# Patient Record
Sex: Female | Born: 1975 | Race: White | Hispanic: No | Marital: Single | Smoking: Former smoker
Health system: Southern US, Community
[De-identification: ages and names within clinical notes are randomized; demographics above are authoritative.]

## PROBLEM LIST (undated history)

## (undated) DIAGNOSIS — E669 Obesity, unspecified: Secondary | ICD-10-CM

## (undated) HISTORY — PX: ANKLE FRACTURE SURGERY: SHX122

## (undated) HISTORY — DX: Obesity, unspecified: E66.9

---

## 2014-09-20 ENCOUNTER — Ambulatory Visit: Payer: Self-pay | Admitting: Family Medicine

## 2014-09-27 ENCOUNTER — Ambulatory Visit (INDEPENDENT_AMBULATORY_CARE_PROVIDER_SITE_OTHER): Payer: PRIVATE HEALTH INSURANCE | Admitting: Sports Medicine

## 2014-09-27 ENCOUNTER — Encounter: Payer: Self-pay | Admitting: Sports Medicine

## 2014-09-27 VITALS — BP 100/65 | HR 89 | Ht 65.0 in | Wt 175.0 lb

## 2014-09-27 DIAGNOSIS — R109 Unspecified abdominal pain: Secondary | ICD-10-CM | POA: Diagnosis not present

## 2014-09-27 DIAGNOSIS — Z0000 Encounter for general adult medical examination without abnormal findings: Secondary | ICD-10-CM | POA: Insufficient documentation

## 2014-09-27 DIAGNOSIS — R21 Rash and other nonspecific skin eruption: Secondary | ICD-10-CM | POA: Insufficient documentation

## 2014-09-27 DIAGNOSIS — L989 Disorder of the skin and subcutaneous tissue, unspecified: Secondary | ICD-10-CM | POA: Diagnosis not present

## 2014-09-27 DIAGNOSIS — Z30011 Encounter for initial prescription of contraceptive pills: Secondary | ICD-10-CM

## 2014-09-27 LAB — POCT URINE PREGNANCY: Preg Test, Ur: NEGATIVE

## 2014-09-27 NOTE — Assessment & Plan Note (Signed)
Checking routine blood work including STD screening. Referral to OB/GYN for consideration of Nexplanon, and to initiate cervical cancer screening.

## 2014-09-27 NOTE — Assessment & Plan Note (Signed)
We will start with over-the-counter Gas-X. I do think this is going to end up being IBS.

## 2014-09-27 NOTE — Progress Notes (Signed)
  Subjective:    CC: Establish care.   HPI:  This is a very pleasant 39 year old female, she comes in to establish care, she would like STD screening, routine blood work, she has a skin lesion on her right anterior upper arm, and mild abdominal discomfort. The abdominal discomfort is located in the left and right lower quadrants, moderate, persistent without radiation, she does get fairly flatulent, and notes improvement in her symptoms with passing gas. She denies any dysuria, no vaginal discharge, and symptoms are not catamenial.  Past medical history, Surgical history, Family history not pertinant except as noted below, Social history, Allergies, and medications have been entered into the medical record, reviewed, and no changes needed.   Review of Systems: No headache, visual changes, nausea, vomiting, diarrhea, constipation, dizziness, abdominal pain, skin rash, fevers, chills, night sweats, swollen lymph nodes, weight loss, chest pain, body aches, joint swelling, muscle aches, shortness of breath, mood changes, visual or auditory hallucinations.  Objective:    General: Well Developed, well nourished, and in no acute distress.  Neuro: Alert and oriented x3, extra-ocular muscles intact, sensation grossly intact.  HEENT: Normocephalic, atraumatic, pupils equal round reactive to light, neck supple, no masses, no lymphadenopathy, thyroid nonpalpable.  Skin: Warm and dry, no rashes noted. There is a 1.5 cm rounded dome shaped lesion that has a variegated appearance, and some hair on the anterior right upper arm. Cardiac: Regular rate and rhythm, no murmurs rubs or gallops.  Respiratory: Clear to auscultation bilaterally. Not using accessory muscles, speaking in full sentences.  Abdominal: Soft, nontender, nondistended, positive bowel sounds, no masses, no organomegaly.  Musculoskeletal: Shoulder, elbow, wrist, hip, knee, ankle stable, and with full range of motion.  Procedure:  Excision of   right upper arm mole Risks, benefits, and alternatives explained and consent obtained. Time out conducted. Surface prepped with alcohol. 5cc lidocaine with epinephine infiltrated in a field block. Adequate anesthesia ensured. Area prepped and draped in a sterile fashion. Excision performed with: Using a 6 mm punch biopsy removed the lesion, the incision was then closed with a 5-0 Ethilon single interrupted suture Hemostasis achieved. Pt stable.  Impression and Recommendations:    The patient was counselled, risk factors were discussed, anticipatory guidance given.

## 2014-09-27 NOTE — Assessment & Plan Note (Signed)
Surgical excision as above. Return in 7 days for suture removal.

## 2014-10-02 LAB — CBC
HCT: 44.7 % (ref 36.0–46.0)
Hemoglobin: 15 g/dL (ref 12.0–15.0)
MCH: 32.5 pg (ref 26.0–34.0)
MCHC: 33.6 g/dL (ref 30.0–36.0)
MCV: 96.8 fL (ref 78.0–100.0)
MPV: 10.4 fL (ref 8.6–12.4)
Platelets: 323 K/uL (ref 150–400)
RBC: 4.62 MIL/uL (ref 3.87–5.11)
RDW: 12.6 % (ref 11.5–15.5)
WBC: 9.6 10*3/uL (ref 4.0–10.5)

## 2014-10-02 LAB — GC/CHLAMYDIA PROBE AMP, URINE
Chlamydia, Swab/Urine, PCR: NEGATIVE
GC Probe Amp, Urine: NEGATIVE

## 2014-10-02 LAB — URINALYSIS
Bilirubin Urine: NEGATIVE
Glucose, UA: NEGATIVE mg/dL
Hgb urine dipstick: NEGATIVE
Ketones, ur: NEGATIVE mg/dL
Leukocytes, UA: NEGATIVE
Nitrite: NEGATIVE
Protein, ur: NEGATIVE mg/dL
Specific Gravity, Urine: 1.028 (ref 1.005–1.030)
Urobilinogen, UA: 0.2 mg/dL (ref 0.0–1.0)
pH: 5.5 (ref 5.0–8.0)

## 2014-10-02 LAB — COMPREHENSIVE METABOLIC PANEL
ALT: 12 U/L (ref 0–35)
AST: 14 U/L (ref 0–37)
Albumin: 4.1 g/dL (ref 3.5–5.2)
BUN: 16 mg/dL (ref 6–23)
Calcium: 9.3 mg/dL (ref 8.4–10.5)
Sodium: 139 mEq/L (ref 135–145)
Total Protein: 7.4 g/dL (ref 6.0–8.3)

## 2014-10-02 LAB — COMPREHENSIVE METABOLIC PANEL WITH GFR
Alkaline Phosphatase: 55 U/L (ref 39–117)
CO2: 24 meq/L (ref 19–32)
Chloride: 103 meq/L (ref 96–112)
Creat: 0.84 mg/dL (ref 0.50–1.10)
Glucose, Bld: 100 mg/dL — ABNORMAL HIGH (ref 70–99)
Potassium: 4.3 meq/L (ref 3.5–5.3)
Total Bilirubin: 0.4 mg/dL (ref 0.2–1.2)

## 2014-10-02 LAB — LIPID PANEL
Cholesterol: 165 mg/dL (ref 0–200)
HDL: 42 mg/dL — ABNORMAL LOW (ref >=46)
LDL Cholesterol: 102 mg/dL — ABNORMAL HIGH (ref 0–99)
Total CHOL/HDL Ratio: 3.9 ratio
Triglycerides: 103 mg/dL (ref <150)
VLDL: 21 mg/dL (ref 0–40)

## 2014-10-02 LAB — HEMOGLOBIN A1C
Hgb A1c MFr Bld: 5.3 % (ref <5.7)
Mean Plasma Glucose: 105 mg/dL (ref <117)

## 2014-10-02 LAB — HEPATITIS PANEL, ACUTE
HCV Ab: NEGATIVE
Hep A IgM: NONREACTIVE
Hep B C IgM: NONREACTIVE
Hepatitis B Surface Ag: NEGATIVE

## 2014-10-02 LAB — HIV ANTIBODY (ROUTINE TESTING W REFLEX): HIV 1&2 Ab, 4th Generation: NONREACTIVE

## 2014-10-02 LAB — RPR

## 2014-10-02 LAB — TSH: TSH: 1.597 u[IU]/mL (ref 0.350–4.500)

## 2014-10-04 LAB — HSV(HERPES SMPLX)ABS-I+II(IGG+IGM)-BLD
HSV 1 Glycoprotein G Ab, IgG: 4.12 IV — ABNORMAL HIGH
HSV 2 Glycoprotein G Ab, IgG: 9.53 IV — ABNORMAL HIGH
Herpes Simplex Vrs I&II-IgM Ab (EIA): 1.19 INDEX — ABNORMAL HIGH

## 2015-11-15 ENCOUNTER — Encounter: Payer: Self-pay | Admitting: Sports Medicine

## 2015-11-15 ENCOUNTER — Ambulatory Visit (INDEPENDENT_AMBULATORY_CARE_PROVIDER_SITE_OTHER): Payer: PRIVATE HEALTH INSURANCE | Admitting: Sports Medicine

## 2015-11-15 VITALS — BP 116/70 | HR 93 | Resp 16 | Wt 173.7 lb

## 2015-11-15 DIAGNOSIS — G43009 Migraine without aura, not intractable, without status migrainosus: Secondary | ICD-10-CM | POA: Diagnosis not present

## 2015-11-15 DIAGNOSIS — N921 Excessive and frequent menstruation with irregular cycle: Secondary | ICD-10-CM | POA: Diagnosis not present

## 2015-11-15 MED ORDER — NORGESTIMATE-ETH ESTRADIOL 0.25-35 MG-MCG PO TABS: 1.0000 | ORAL_TABLET | Freq: Every day | ORAL | Status: DC

## 2015-11-15 MED ORDER — RIZATRIPTAN BENZOATE 10 MG PO TBDP: 10.0000 mg | ORAL_TABLET | ORAL | Status: DC | PRN

## 2015-11-15 NOTE — Progress Notes (Signed)
  Subjective:    CC: Couple of issues  HPI: Headaches: Moderate, persistent, bitemporal, associated with photophobia and phonophobia, slight nausea. They last for several days. Has multiple per month.  Metrorrhagia: Bleeding every 2 weeks, unsure if she's pregnant, never got Nexplanon, minimal pain with menstruation.  Past medical history, Surgical history, Family history not pertinant except as noted below, Social history, Allergies, and medications have been entered into the medical record, reviewed, and no changes needed.   Review of Systems: No fevers, chills, night sweats, weight loss, chest pain, or shortness of breath.   Objective:    General: Well Developed, well nourished, and in no acute distress.  Neuro: Alert and oriented x3, extra-ocular muscles intact, sensation grossly intact. Cranial nerves II through XII are intact, motor, sensory, and coordinative functions are all intact.  HEENT: Normocephalic, atraumatic, pupils equal round reactive to light, neck supple, no masses, no lymphadenopathy, thyroid nonpalpable.  Skin: Warm and dry, no rashes. Cardiac: Regular rate and rhythm, no murmurs rubs or gallops, no lower extremity edema.  Respiratory: Clear to auscultation bilaterally. Not using accessory muscles, speaking in full sentences. Abdomen: Soft, nontender, nondistended, normal bowel sounds, no palpable masses, no guarding, rigidity, rebound tenderness.  Impression and Recommendations:    I spent 25 minutes with this patient, greater than 50% was face-to-face time counseling regarding the above diagnoses

## 2015-11-15 NOTE — Assessment & Plan Note (Signed)
Starting OCPs and Maxalt.

## 2015-11-15 NOTE — Assessment & Plan Note (Signed)
Patient unable to void so sending for serum hCG, TSH, CBC, PT, PTT,  CMP. Pelvic and transvaginal ultrasound, and essentially teeing her up forreferral to OB/GYN

## 2015-11-16 ENCOUNTER — Telehealth: Payer: Self-pay

## 2015-11-16 LAB — COMPREHENSIVE METABOLIC PANEL
ALT: 15 U/L (ref 6–29)
AST: 19 U/L (ref 10–30)
Albumin: 4.5 g/dL (ref 3.6–5.1)
Alkaline Phosphatase: 46 U/L (ref 33–115)
BUN: 11 mg/dL (ref 7–25)
Calcium: 9.4 mg/dL (ref 8.6–10.2)
Glucose, Bld: 63 mg/dL — ABNORMAL LOW (ref 65–99)
Potassium: 3.9 mmol/L (ref 3.5–5.3)
Total Bilirubin: 0.4 mg/dL (ref 0.2–1.2)
Total Protein: 7.7 g/dL (ref 6.1–8.1)

## 2015-11-16 LAB — HCG, SERUM, QUALITATIVE: Preg, Serum: NEGATIVE

## 2015-11-16 LAB — PROTIME-INR
INR: 0.98 (ref <1.50)
Prothrombin Time: 13.1 s (ref 11.6–15.2)

## 2015-11-16 LAB — CBC
HCT: 45.2 % — ABNORMAL HIGH (ref 35.0–45.0)
Hemoglobin: 15.2 g/dL (ref 11.7–15.5)
MCH: 33.1 pg — ABNORMAL HIGH (ref 27.0–33.0)
MCHC: 33.6 g/dL (ref 32.0–36.0)
MCV: 98.5 fL (ref 80.0–100.0)
MPV: 9.6 fL (ref 7.5–12.5)
Platelets: 337 K/uL (ref 140–400)
RBC: 4.59 MIL/uL (ref 3.80–5.10)
RDW: 12.6 % (ref 11.0–15.0)
WBC: 8 K/uL (ref 3.8–10.8)

## 2015-11-16 LAB — COMPREHENSIVE METABOLIC PANEL WITH GFR
CO2: 23 mmol/L (ref 20–31)
Chloride: 103 mmol/L (ref 98–110)
Creat: 0.86 mg/dL (ref 0.50–1.10)
Sodium: 140 mmol/L (ref 135–146)

## 2015-11-16 LAB — APTT: aPTT: 28 s (ref 24–37)

## 2015-11-16 LAB — TSH: TSH: 1.45 mIU/L

## 2015-11-16 NOTE — Telephone Encounter (Signed)
Patient called and states the insurance company will not pay for disintegrating tablets. Could she has this switched to the plain tablets. Please advise.

## 2015-11-17 MED ORDER — RIZATRIPTAN BENZOATE 10 MG PO TABS: 10.0000 mg | ORAL_TABLET | ORAL | Status: DC | PRN

## 2015-11-17 NOTE — Telephone Encounter (Signed)
Left message advising of the new prescription.

## 2015-11-17 NOTE — Telephone Encounter (Signed)
Done

## 2015-11-21 NOTE — Addendum Note (Signed)
Addended by: Huel Cote on: 11/21/2015 10:57 AM   Modules accepted: Orders

## 2015-11-21 NOTE — Addendum Note (Signed)
Addended by: Huel Cote on: 11/21/2015 10:55 AM   Modules accepted: Orders

## 2015-11-24 ENCOUNTER — Ambulatory Visit (INDEPENDENT_AMBULATORY_CARE_PROVIDER_SITE_OTHER): Payer: PRIVATE HEALTH INSURANCE

## 2015-11-24 DIAGNOSIS — N83201 Unspecified ovarian cyst, right side: Secondary | ICD-10-CM | POA: Diagnosis not present

## 2015-11-24 DIAGNOSIS — N921 Excessive and frequent menstruation with irregular cycle: Secondary | ICD-10-CM

## 2015-11-24 DIAGNOSIS — N83202 Unspecified ovarian cyst, left side: Secondary | ICD-10-CM | POA: Diagnosis not present

## 2015-11-28 ENCOUNTER — Telehealth: Payer: Self-pay | Admitting: *Deleted

## 2015-11-28 NOTE — Telephone Encounter (Addendum)
Called patient and left a message asking her if she was able to get the last prescription filled( non disintegrating tablets) received a a request for PA but its not clear if its for the new rx or disintegrating tabs.submitted PA anyway.

## 2015-11-29 NOTE — Telephone Encounter (Signed)
Response back from insurance was not prior auth required

## 2015-12-08 ENCOUNTER — Encounter: Payer: Self-pay | Admitting: Obstetrics & Gynecology

## 2015-12-08 ENCOUNTER — Ambulatory Visit (INDEPENDENT_AMBULATORY_CARE_PROVIDER_SITE_OTHER): Payer: PRIVATE HEALTH INSURANCE | Admitting: Obstetrics & Gynecology

## 2015-12-08 VITALS — BP 110/71 | HR 96 | Resp 16 | Ht 63.0 in | Wt 173.0 lb

## 2015-12-08 DIAGNOSIS — Z124 Encounter for screening for malignant neoplasm of cervix: Secondary | ICD-10-CM | POA: Diagnosis not present

## 2015-12-08 DIAGNOSIS — N83202 Unspecified ovarian cyst, left side: Secondary | ICD-10-CM

## 2015-12-08 DIAGNOSIS — Z01419 Encounter for gynecological examination (general) (routine) without abnormal findings: Secondary | ICD-10-CM | POA: Diagnosis not present

## 2015-12-08 DIAGNOSIS — N83201 Unspecified ovarian cyst, right side: Secondary | ICD-10-CM | POA: Diagnosis not present

## 2015-12-08 DIAGNOSIS — Z1151 Encounter for screening for human papillomavirus (HPV): Secondary | ICD-10-CM | POA: Diagnosis not present

## 2015-12-08 DIAGNOSIS — Z113 Encounter for screening for infections with a predominantly sexual mode of transmission: Secondary | ICD-10-CM

## 2015-12-08 NOTE — Progress Notes (Signed)
Subjective:    Colleen Miller is a 40 y.o. SWP2 (45 and 68 yo daughters and 59 yo grandson) female who presents for an annual exam and to discuss her u/s finding of normal ovarian cysts. The patient has no complaints today. The u/s was ordered to evaluate her irregular periods, which come every 2 weeks for the last 2 months.  Her TSH was normal. The patient is sexually active. GYN screening history: last pap: was normal. The patient wears seatbelts: yes. The patient participates in regular exercise: yes. Has the patient ever been transfused or tattooed?: yes. The patient reports that there is not domestic violence in her life.   Menstrual History: OB History    Gravida Para Term Preterm AB TAB SAB Ectopic Multiple Living   2 2 2       2       Menarche age: 55  Patient's last menstrual period was 12/05/2015.    The following portions of the patient's history were reviewed and updated as appropriate: allergies, current medications, past family history, past medical history, past social history, past surgical history and problem list.  Review of Systems Pertinent items are noted in HPI.  Works at [employer]. Monogamous for about 2 years, lives together. Some dyspareunia for about 6 months, positional. She doesn't use contraception, for 15 years. She had a "bad case of syphillis about 8 years ago". She does not want OCPs.   Objective:    BP 110/71 mmHg  Pulse 96  Resp 16  Ht 5' 3"  (1.6 m)  Wt 173 lb (78.472 kg)  BMI 30.65 kg/m2  LMP 12/05/2015  General Appearance:    Alert, cooperative, no distress, appears stated age  Head:    Normocephalic, without obvious abnormality, atraumatic  Eyes:    PERRL, conjunctiva/corneas clear, EOM's intact, fundi    benign, both eyes  Ears:    Normal TM's and external ear canals, both ears  Nose:   Nares normal, septum midline, mucosa normal, no drainage    or sinus tenderness  Throat:   Lips, mucosa, and tongue normal; teeth and gums normal  Neck:    Supple, symmetrical, trachea midline, no adenopathy;    thyroid:  no enlargement/tenderness/nodules; no carotid   bruit or JVD  Back:     Symmetric, no curvature, ROM normal, no CVA tenderness  Lungs:     Clear to auscultation bilaterally, respirations unlabored  Chest Wall:    No tenderness or deformity   Heart:    Regular rate and rhythm, S1 and S2 normal, no murmur, rub   or gallop  Breast Exam:    No tenderness, masses, or nipple abnormality  Abdomen:     Soft, non-tender, bowel sounds active all four quadrants,    no masses, no organomegaly  Genitalia:    Normal female without lesion, discharge or tenderness, NSSA, NT, mobile, normal adnexal exam     Extremities:   Extremities normal, atraumatic, no cyanosis or edema  Pulses:   2+ and symmetric all extremities  Skin:   Skin color, texture, turgor normal, no rashes or lesions  Lymph nodes:   Cervical, supraclavicular, and axillary nodes normal  Neurologic:   CNII-XII intact, normal strength, sensation and reflexes    throughout  .    Assessment:    Healthy female exam.   DUB   Plan:     Thin prep Pap smear. with cotesting She declines contraception or OCPs for treatment of DUB

## 2015-12-14 LAB — CYTOLOGY - PAP

## 2015-12-24 ENCOUNTER — Other Ambulatory Visit (HOSPITAL_COMMUNITY): Payer: Self-pay | Admitting: Obstetrics & Gynecology

## 2015-12-24 DIAGNOSIS — Z1231 Encounter for screening mammogram for malignant neoplasm of breast: Secondary | ICD-10-CM

## 2016-01-04 ENCOUNTER — Ambulatory Visit: Payer: PRIVATE HEALTH INSURANCE

## 2016-02-29 ENCOUNTER — Emergency Department (INDEPENDENT_AMBULATORY_CARE_PROVIDER_SITE_OTHER)
Admission: EM | Admit: 2016-02-29 | Discharge: 2016-02-29 | Disposition: A | Discharge status: Home / Self Care | Payer: PRIVATE HEALTH INSURANCE | Source: Home / Self Care | Attending: Family Medicine | Admitting: Family Medicine

## 2016-02-29 DIAGNOSIS — L089 Local infection of the skin and subcutaneous tissue, unspecified: Secondary | ICD-10-CM | POA: Diagnosis not present

## 2016-02-29 DIAGNOSIS — W57XXXA Bitten or stung by nonvenomous insect and other nonvenomous arthropods, initial encounter: Secondary | ICD-10-CM | POA: Diagnosis not present

## 2016-02-29 DIAGNOSIS — T148 Other injury of unspecified body region: Secondary | ICD-10-CM

## 2016-02-29 MED ORDER — HYDROXYZINE HCL 25 MG PO TABS: 25.0000 mg | ORAL_TABLET | Freq: Three times a day (TID) | ORAL | 0 refills | Status: DC | PRN

## 2016-02-29 MED ORDER — CEPHALEXIN 500 MG PO CAPS: 500.0000 mg | ORAL_CAPSULE | Freq: Three times a day (TID) | ORAL | 0 refills | Status: AC

## 2016-02-29 MED ORDER — HYDROCORTISONE 2.5 % EX LOTN: TOPICAL_LOTION | Freq: Two times a day (BID) | CUTANEOUS | 0 refills | Status: DC

## 2016-02-29 NOTE — ED Triage Notes (Signed)
Patient states she noticed 2 spider/insects bites on both of her arms at lunch time yesterday. Site is red, swollen, painful, itchy. States she has not tried anything OTC for relief but did put rubbing alcohol on site to help with the itching.

## 2016-02-29 NOTE — ED Provider Notes (Signed)
CSN: [id number]     Arrival date & time 02/29/16  1925 History   First MD Initiated Contact with Patient 02/29/16 1938     Chief Complaint  Patient presents with  . Insect Bite   (Consider location/radiation/quality/duration/timing/severity/associated sxs/prior Treatment) HPI  Colleen Miller is a 40 y.o. female presenting to UC with c/o 2 small red bumps, one on each forearm, that started yesterday. Moderately pruritic with worsening redness, and drainage from bump on Right forearm.  She has not tried anything besides rubbing alcohol for symptoms. No relief. Denies fever, chills, n/v/d. Pt notes she does wear a long-sleeve smock at work and wonders if there was something in the sleeves that cause current rash/possible insect bites. No new soaps, lotions or medications. No other rashes.    Past Medical History:  Diagnosis Date  . Obesity (BMI 30-39.9)    Past Surgical History:  Procedure Laterality Date  . ANKLE FRACTURE SURGERY     Family History  Problem Relation Age of Onset  . Hyperlipidemia Mother   . Cancer Maternal Grandmother     pancreatic  . Heart attack Maternal Grandfather    Social History  Substance Use Topics  . Smoking status: Former Smoker    Packs/day: 0.50    Years: 26.00    Types: Cigarettes  . Smokeless tobacco: Never Used  . Alcohol use No   OB History    Gravida Para Term Preterm AB Living   2 2 2     2    SAB TAB Ectopic Multiple Live Births                 Review of Systems  Constitutional: Negative for chills and fever.  Gastrointestinal: Negative for nausea and vomiting.  Musculoskeletal: Negative for arthralgias and myalgias.  Skin: Positive for color change, rash and wound. Negative for pallor.    Allergies  Review of patient's allergies indicates no known allergies.  Home Medications   Prior to Admission medications   Medication Sig Start Date End Date Taking? Authorizing Provider  cephALEXin (KEFLEX) 500 MG capsule Take 1 capsule  (500 mg total) by mouth 3 (three) times daily. 02/29/16 03/07/16  Noland Fordyce, PA-C  hydrocortisone 2.5 % lotion Apply topically 2 (two) times daily. As needed for itching 02/29/16   Noland Fordyce, PA-C  hydrOXYzine (ATARAX/VISTARIL) 25 MG tablet Take 1 tablet (25 mg total) by mouth every 8 (eight) hours as needed for itching or vomiting. 02/29/16   Noland Fordyce, PA-C  rizatriptan (MAXALT) 10 MG tablet Take 1 tablet (10 mg total) by mouth as needed for migraine. May repeat in 2 hours if needed Patient not taking: Reported on 12/08/2015 11/17/15   Silverio Decamp, MD   Meds Ordered and Administered this Visit  Medications - No data to display  BP 101/68 (BP Location: Left Arm)   Pulse 95   Temp 98.2 F (36.8 C) (Oral)   Ht 5' 5"  (1.651 m)   Wt 184 lb (83.5 kg)   LMP 02/15/2016   SpO2 98%   BMI 30.62 kg/m  No data found.   Physical Exam  Constitutional: She is oriented to person, place, and time. She appears well-developed and well-nourished.  HENT:  Head: Normocephalic and atraumatic.  Mouth/Throat: Oropharynx is clear and moist.  Cardiovascular: Normal rate.   Pulmonary/Chest: Effort normal. No respiratory distress.  Musculoskeletal: Normal range of motion. She exhibits tenderness. She exhibits no edema.  Full ROM bilateral arms, mild tenderness to forearms (See skin exam)  Neurological: She is alert and oriented to person, place, and time.  Skin: Skin is warm and dry. Rash noted. There is erythema.  Right forearm, dorsal aspect: 0.5cm pustule with small amount of yellow purulent drainage oozing out of lesion.  1cm are of erythema surrounding pustule. No induration or fluctuance. Mildly tender. Left forearm, dorsal aspect: 0.5cm pustule with minimal surrounding erythema. No bleeding or drainage.    Nursing note and vitals reviewed.   Urgent Care Course   Clinical Course    Procedures (including critical care time)  Labs Review Labs Reviewed - No data to  display  Imaging Review No results found.    MDM   1. Infected insect bite    Pt presenting to [facility] with rash and a pustule on both forearms. Pustule on Right forearm spontaneously draining. C/w infected insect bites.  Rx: keflex, hydrocortisone cream and atarax   Encouraged to keep wounds clean with soap and water. F/u wih PCP or return to [facility] in 3-4 days if not improving, sooner if worsening. Patient verbalized understanding and agreement with treatment plan.     Noland Fordyce, PA-C 03/01/16 [phone]

## 2016-02-29 NOTE — Discharge Instructions (Signed)
°  Keep areas clean with warm soap and water. You may use over the counter antibiotic ointment on open wound.  If rash is itching, you may use the hydrocortisone cream as prescribed. Please be sure to take oral antibiotic pills (cephalexin- Keflex) as prescribed and complete entire course even if feeling better to ensure infection does not come back.

## 2016-05-02 LAB — HM MAMMOGRAPHY

## 2016-05-08 ENCOUNTER — Encounter: Payer: Self-pay | Admitting: Sports Medicine

## 2016-05-09 ENCOUNTER — Encounter: Payer: Self-pay | Admitting: Sports Medicine

## 2016-08-06 ENCOUNTER — Ambulatory Visit (INDEPENDENT_AMBULATORY_CARE_PROVIDER_SITE_OTHER): Payer: PRIVATE HEALTH INSURANCE | Admitting: Obstetrics & Gynecology

## 2016-08-06 ENCOUNTER — Encounter: Payer: Self-pay | Admitting: Obstetrics & Gynecology

## 2016-08-06 VITALS — BP 107/61 | HR 81 | Resp 16 | Ht 64.0 in | Wt 178.0 lb

## 2016-08-06 DIAGNOSIS — B9689 Other specified bacterial agents as the cause of diseases classified elsewhere: Secondary | ICD-10-CM

## 2016-08-06 DIAGNOSIS — B3731 Acute candidiasis of vulva and vagina: Secondary | ICD-10-CM

## 2016-08-06 DIAGNOSIS — B373 Candidiasis of vulva and vagina: Secondary | ICD-10-CM | POA: Diagnosis not present

## 2016-08-06 DIAGNOSIS — N760 Acute vaginitis: Secondary | ICD-10-CM | POA: Diagnosis not present

## 2016-08-06 MED ORDER — METRONIDAZOLE 500 MG PO TABS: 500.0000 mg | ORAL_TABLET | Freq: Two times a day (BID) | ORAL | 6 refills | Status: DC

## 2016-08-06 MED ORDER — FLUCONAZOLE 150 MG PO TABS: 150.0000 mg | ORAL_TABLET | Freq: Once | ORAL | 3 refills | Status: AC

## 2016-08-06 NOTE — Progress Notes (Signed)
   Subjective:    Patient ID: Colleen Miller, female    DOB: [birth year], 41 y.o.   MRN: [id number]  HPI 41 yo lady here with a week of a "smelly vaginal discharge". She has had BV in the past.   Review of Systems     Objective:   Physical Exam Pleasant [id number] Breathing, conversing, and ambulating normally Vaginal discharge c/w BV and yeast       Assessment & Plan:  BV and yeast-  Treat with flagyl and diflucan, refills given Rec boric acid supp prn and probiotics daily

## 2016-11-29 ENCOUNTER — Encounter: Payer: Self-pay | Admitting: Emergency Medicine

## 2016-11-29 ENCOUNTER — Other Ambulatory Visit: Payer: Self-pay

## 2016-11-29 ENCOUNTER — Emergency Department (INDEPENDENT_AMBULATORY_CARE_PROVIDER_SITE_OTHER)
Admission: EM | Admit: 2016-11-29 | Discharge: 2016-11-29 | Disposition: A | Discharge status: Home / Self Care | Payer: PRIVATE HEALTH INSURANCE | Source: Home / Self Care | Attending: Family Medicine | Admitting: Family Medicine

## 2016-11-29 ENCOUNTER — Emergency Department (INDEPENDENT_AMBULATORY_CARE_PROVIDER_SITE_OTHER): Payer: PRIVATE HEALTH INSURANCE

## 2016-11-29 DIAGNOSIS — M940 Chondrocostal junction syndrome [Tietze]: Secondary | ICD-10-CM

## 2016-11-29 DIAGNOSIS — R079 Chest pain, unspecified: Secondary | ICD-10-CM

## 2016-11-29 MED ORDER — MELOXICAM 15 MG PO TABS: 15.0000 mg | ORAL_TABLET | Freq: Every day | ORAL | 1 refills | Status: DC

## 2016-11-29 NOTE — ED Triage Notes (Signed)
Began noticing chest pain upon inspiration at 11:30am today. No recent URI, muscle strain, or injury. No known history of heart disease.

## 2016-11-29 NOTE — Discharge Instructions (Signed)
Apply ice pack for 20 to 30 minutes, 3 to 4 times daily  Continue until pain and swelling decrease.  May take Ibuprofen today as needed.

## 2016-11-29 NOTE — ED Provider Notes (Signed)
[facility]    CSN: [id number] Arrival date & time: 11/29/16  1451     History   Chief Complaint Chief Complaint  Patient presents with  . Chest Pain    with inspiration    HPI Colleen Miller is a 41 y.o. female.   At 11:30am today patient began noticing pain in her left anterior chest with inspiration.  The pain does not radiate.  No nausea/vomiting or sweats.  No recent URI, injury or muscle strain.  No history of heart disease.  She does have a family history of ASCVD.   The history is provided by the patient.  Chest Pain  Pain location:  L chest and substernal area Pain quality: stabbing   Pain quality: not radiating   Pain radiates to:  Does not radiate Pain severity:  Mild Onset quality:  Sudden Duration:  4 hours Timing:  Intermittent Progression:  Unchanged Chronicity:  New Context: breathing and movement   Relieved by:  None tried Worsened by:  Coughing, movement and deep breathing Ineffective treatments:  None tried Associated symptoms: no abdominal pain, no AICD problem, no cough, no diaphoresis, no dysphagia, no fatigue, no fever, no heartburn, no lower extremity edema, no nausea, no near-syncope, no numbness, no palpitations, no shortness of breath and no syncope     Past Medical History:  Diagnosis Date  . Obesity (BMI 30-39.9)     Patient Active Problem List   Diagnosis Date Noted  . Migraine headache without aura 11/15/2015  . Metrorrhagia 11/15/2015  . Annual physical exam 09/27/2014    Past Surgical History:  Procedure Laterality Date  . ANKLE FRACTURE SURGERY      OB History    Gravida Para Term Preterm AB Living   2 2 2     2    SAB TAB Ectopic Multiple Live Births                   Home Medications    Prior to Admission medications   Medication Sig Start Date End Date Taking? Authorizing Provider  meloxicam (MOBIC) 15 MG tablet Take 1 tablet (15 mg total) by mouth daily. Take with food each morning 11/29/16   Kandra Nicolas, MD  rizatriptan (MAXALT) 10 MG tablet Take 1 tablet (10 mg total) by mouth as needed for migraine. May repeat in 2 hours if needed Patient not taking: Reported on 08/06/2016 11/17/15   Silverio Decamp, MD    Family History Family History  Problem Relation Age of Onset  . Hyperlipidemia Mother   . Cancer Maternal Grandmother        pancreatic  . Heart attack Maternal Grandfather     Social History Social History  Substance Use Topics  . Smoking status: Former Smoker    Packs/day: 0.50    Years: 26.00    Types: Cigarettes  . Smokeless tobacco: Never Used  . Alcohol use No     Allergies   Patient has no known allergies.   Review of Systems Review of Systems  Constitutional: Negative for diaphoresis, fatigue and fever.  HENT: Negative for trouble swallowing.   Respiratory: Negative for cough and shortness of breath.   Cardiovascular: Positive for chest pain. Negative for palpitations, syncope and near-syncope.  Gastrointestinal: Negative for abdominal pain, heartburn and nausea.  Neurological: Negative for numbness.     Physical Exam Triage Vital Signs ED Triage Vitals  Enc Vitals Group     BP 11/29/16 1507 120/77  Pulse Rate 11/29/16 1507 82     Resp 11/29/16 1507 16     Temp 11/29/16 1507 98.1 F (36.7 C)     Temp Source 11/29/16 1507 Oral     SpO2 11/29/16 1507 99 %     Weight 11/29/16 1508 170 lb (77.1 kg)     Height 11/29/16 1508 5' 5"  (1.651 m)     Head Circumference --      Peak Flow --      Pain Score 11/29/16 1509 5     Pain Loc --      Pain Edu? --      Excl. in [place]? --    No data found.   Updated Vital Signs BP 120/77 (BP Location: Left Arm)   Pulse 82   Temp 98.1 F (36.7 C) (Oral)   Resp 16   Ht 5' 5"  (1.651 m)   Wt 170 lb (77.1 kg)   LMP 11/20/2016   SpO2 99%   BMI 28.29 kg/m   Visual Acuity Right Eye Distance:   Left Eye Distance:   Bilateral Distance:    Right Eye Near:   Left Eye Near:    Bilateral  Near:     Physical Exam  Constitutional: She appears well-developed and well-nourished. No distress.  HENT:  Head: Normocephalic.  Nose: Nose normal.  Mouth/Throat: Oropharynx is clear and moist.  Eyes: Conjunctivae are normal. Pupils are equal, round, and reactive to light.  Neck: Neck supple.  Cardiovascular: Normal rate, regular rhythm and normal heart sounds.   Pulmonary/Chest: Breath sounds normal. She exhibits tenderness.    Chest:  Distinct tenderness to palpation over the upper sternum as noted on diagram.  Palpation there recreates her chest pain.  Abdominal: There is no tenderness.  Musculoskeletal: She exhibits no edema or tenderness.  Lymphadenopathy:    She has no cervical adenopathy.  Neurological: She is alert.  Skin: Skin is warm and dry.  Nursing note and vitals reviewed.    UC Treatments / Results  Labs (all labs ordered are listed, but only abnormal results are displayed) Labs Reviewed - No data to display  EKG  EKG Interpretation  Rate:  83 BPM PR:  142 msec QT:  376 msec QTcH:  441 msec QRSD:  76 msec QRS axis:  49 degrees Interpretation:  Normal sinus rhythm; within normal limits        Radiology Dg Chest 2 View  Result Date: 11/29/2016 CLINICAL DATA:  Chest pain. EXAM: CHEST  2 VIEW COMPARISON:  No recent prior. FINDINGS: Mediastinum and hilar structures normal. Lungs are clear. Heart size normal. No pleural effusion or pneumothorax. Thoracic spine scoliosis. IMPRESSION: No acute cardiopulmonary disease. Electronically Signed   By: Marcello Moores  Register   On: 11/29/2016 15:36    Procedures Procedures (including critical care time)  Medications Ordered in UC Medications - No data to display   Initial Impression / Assessment and Plan / UC Course  I have reviewed the triage vital signs and the nursing notes.  Pertinent labs & imaging results that were available during my care of the patient were reviewed by me and considered in my medical  decision making (see chart for details).    Normal EKG and chest X-ray reassuring. Begin Mobic 42m daily. Apply ice pack for 20 to 30 minutes, 3 to 4 times daily  Continue until pain and swelling decrease.  May take Ibuprofen today as needed, and begin Mobic tomorrow. Followup with Dr. TAundria Memsor Dr. EEllard Artis  Georgina Snell ([facility]) if not improving about two weeks.     Final Clinical Impressions(s) / UC Diagnoses   Final diagnoses:  Costochondritis, acute    New Prescriptions New Prescriptions   MELOXICAM (MOBIC) 15 MG TABLET    Take 1 tablet (15 mg total) by mouth daily. Take with food each morning     Kandra Nicolas, MD 12/10/16 [phone]

## 2018-04-05 IMAGING — US US TRANSVAGINAL NON-OB
1 series · 13 of 25 positions shown · non-contrast
Comparison: None

CLINICAL DATA: Fatty menses every 2 weeks for the past 2 months,
bleeding starts off normally and changes to spotting, metrorrhagia



[Series 1: us transvaginal non-ob · 0.17mm/px · 13 of 103 slices shown]
[im 1/103]
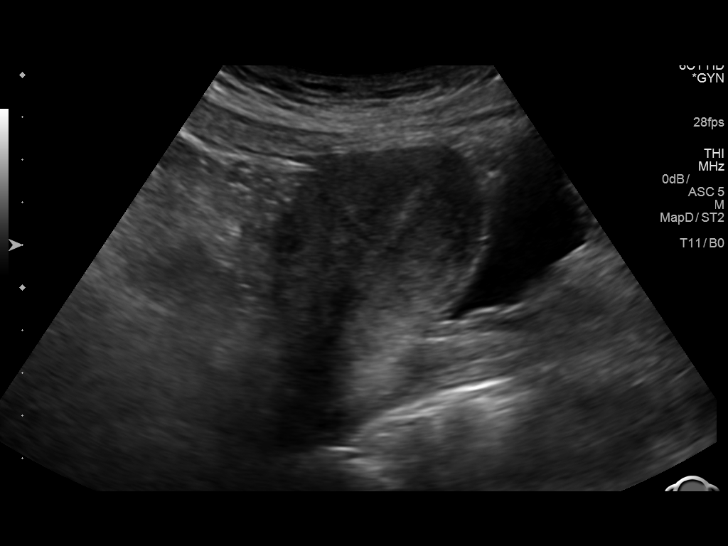
[im 9/103]
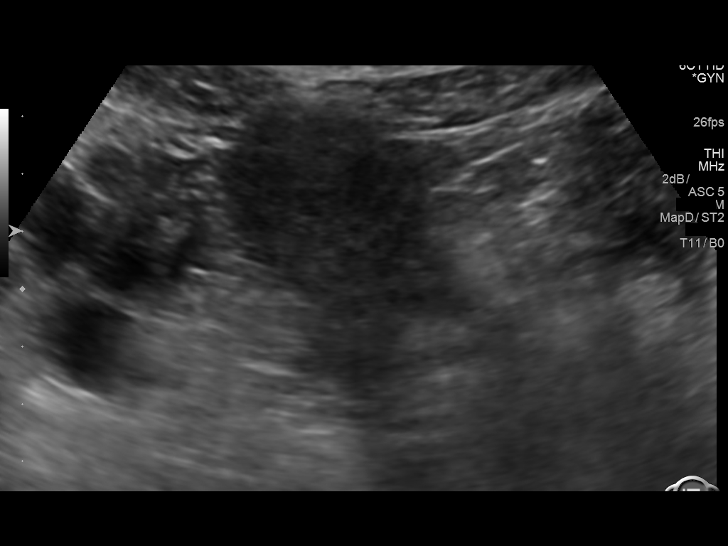
[im 18/103]
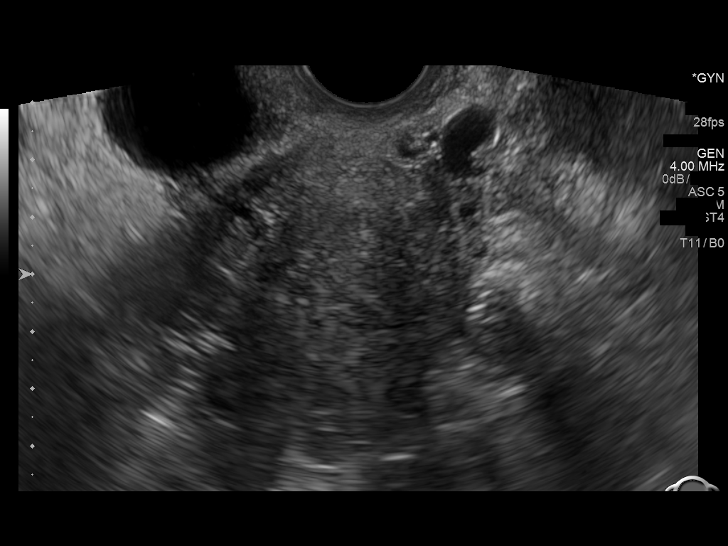
[im 26/103]
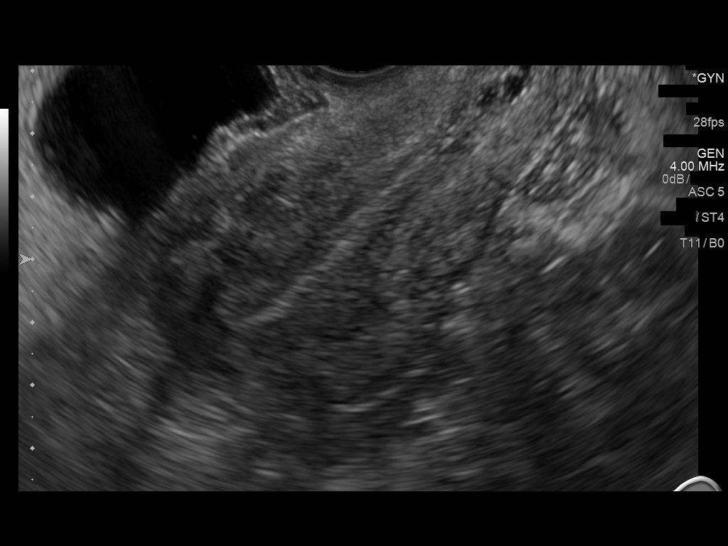
[im 35/103]
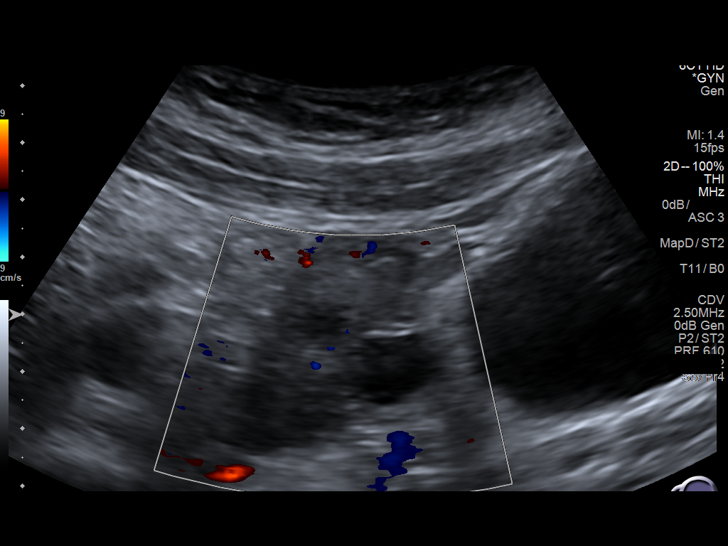
[im 43/103]
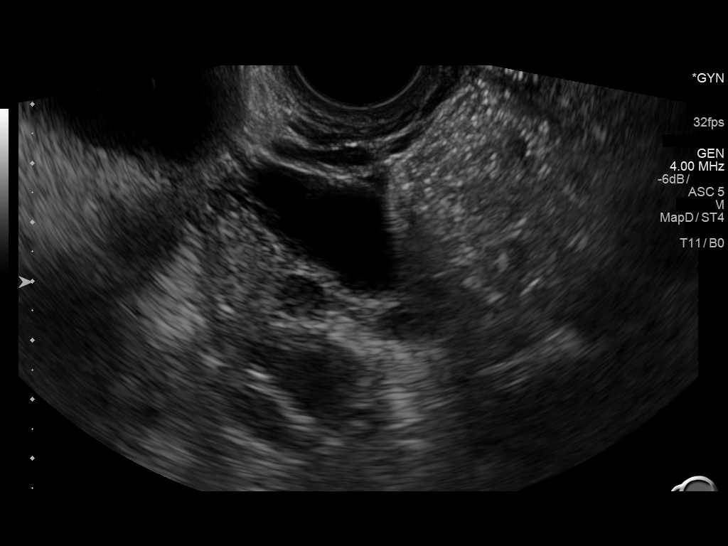
[im 52/103]
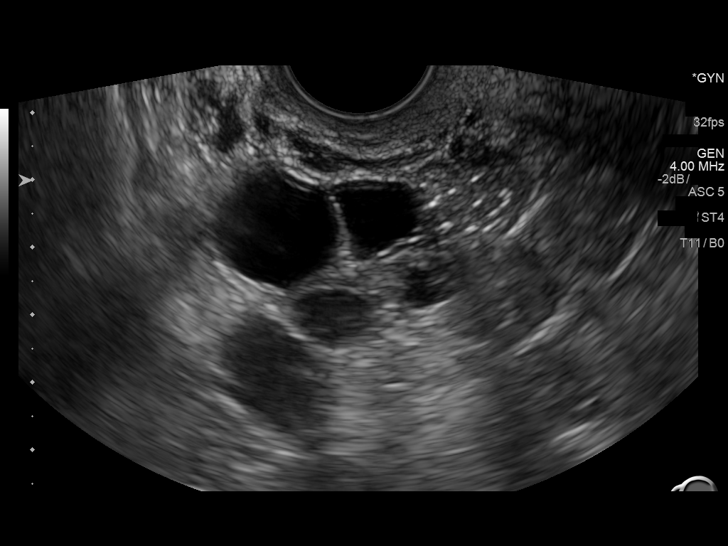
[im 60/103]
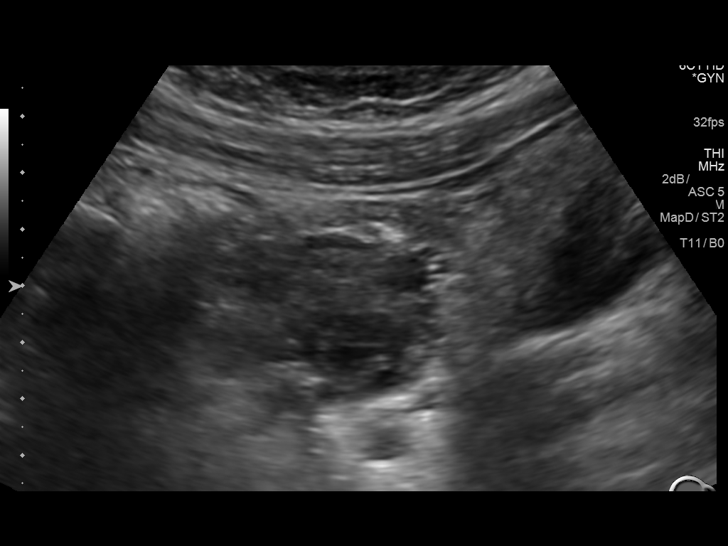
[im 69/103]
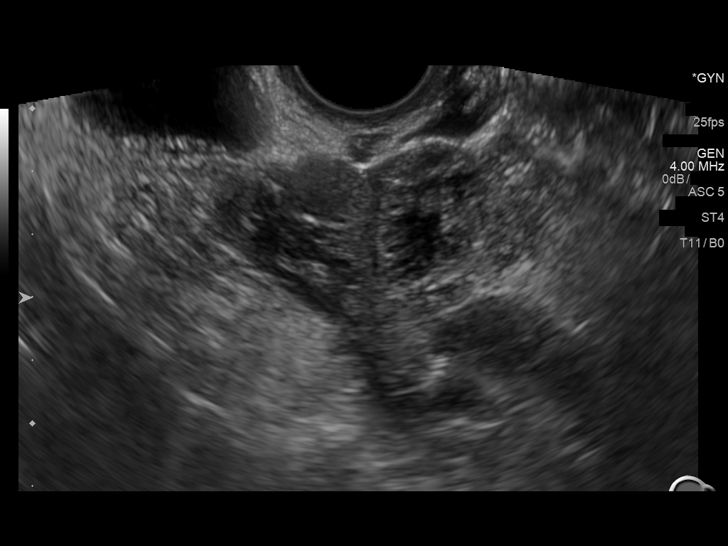
[im 77/103]
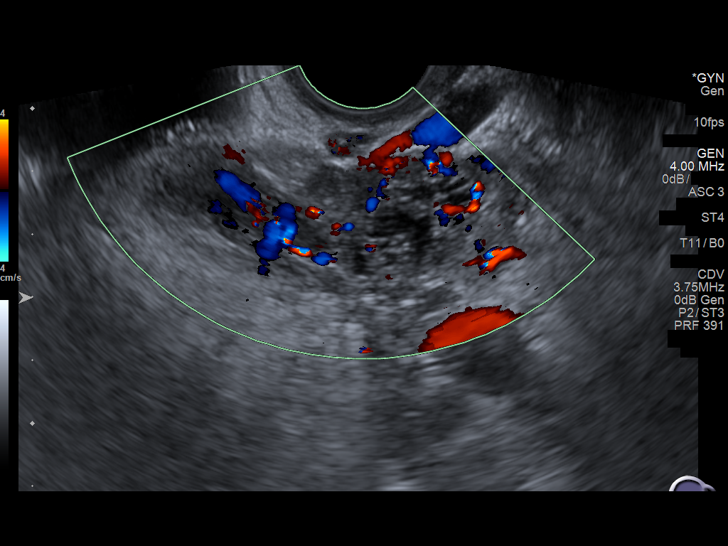
[im 86/103]
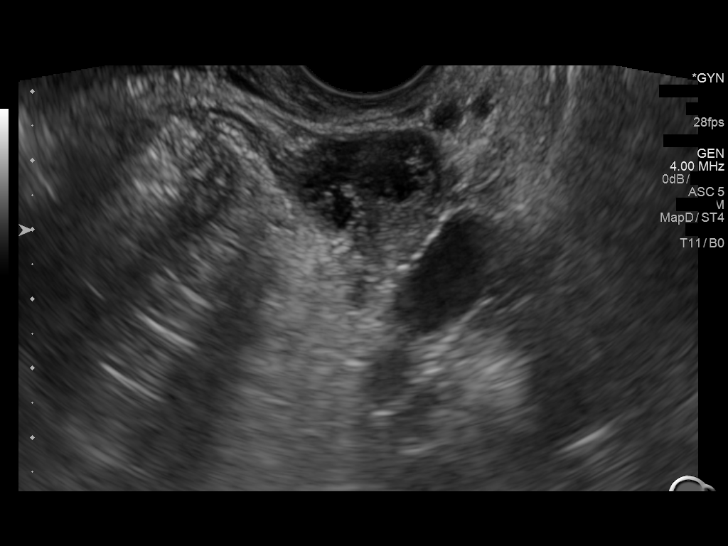
[im 94/103]
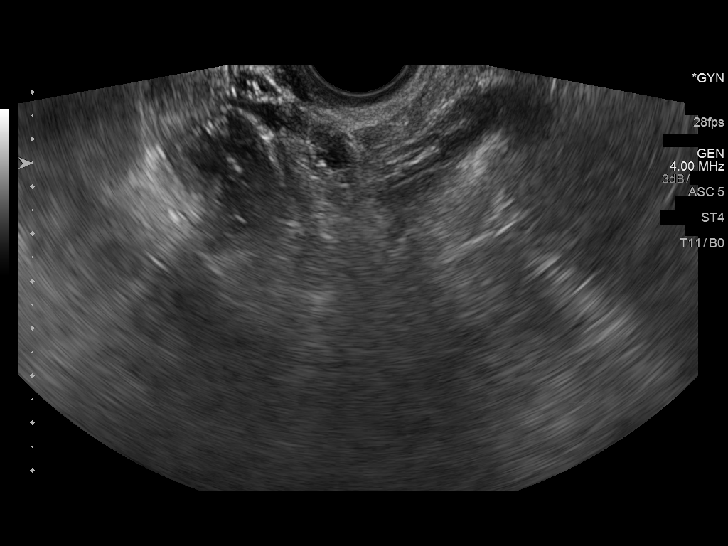
[im 103/103]
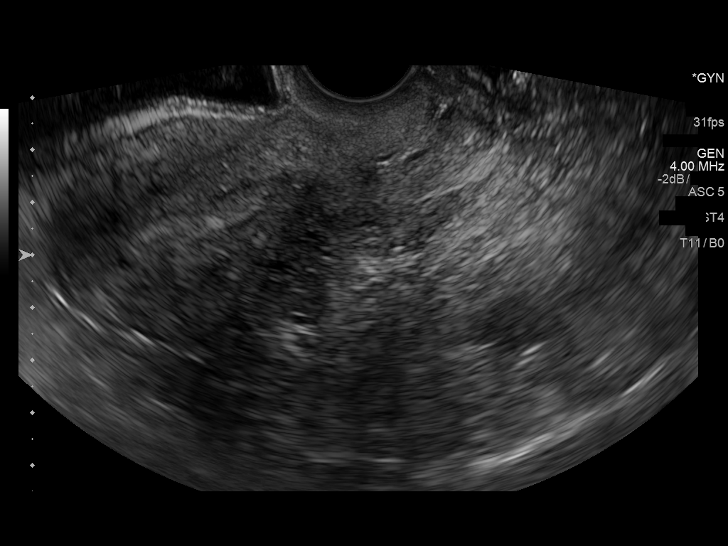

[13 of 25 positions shown; findings below may reference images not displayed]

FINDINGS: Uterus

Measurements: 9.0 x 4.4 x 5.0 cm. Normal morphology without mass.

Endometrium

Thickness: 8 mm thick, normal. No endometrial fluid or focal
abnormalities

Right ovary

Measurements: 4.6 x 3.7 x 3.6 cm. Small cysts largest 3.7 x 1.8 x
1.9 cm, simple in character. Blood flow present within RIGHT ovary
on color Doppler imaging.

Left ovary

Measurements: 5.2 x 2.7 x 2.4 cm. Small hemorrhagic follicle cyst
LEFT ovary 1.9 x 1.7 x 2.3 cm. No additional masses. Blood flow
present within LEFT ovary on color Doppler imaging.

Other findings

No free pelvic fluid or additional adnexal masses.
IMPRESSION: Small simple appearing RIGHT ovarian cyst.

Small hemorrhagic follicle cyst LEFT ovary.

Otherwise normal exam.

## 2018-04-20 IMAGING — DX DG CHEST 2V
2 series · 2 of 2 positions shown · non-contrast
Comparison: No recent prior.

CLINICAL DATA: Chest pain.

EXAM:
CHEST  2 VIEW

[chest pa]
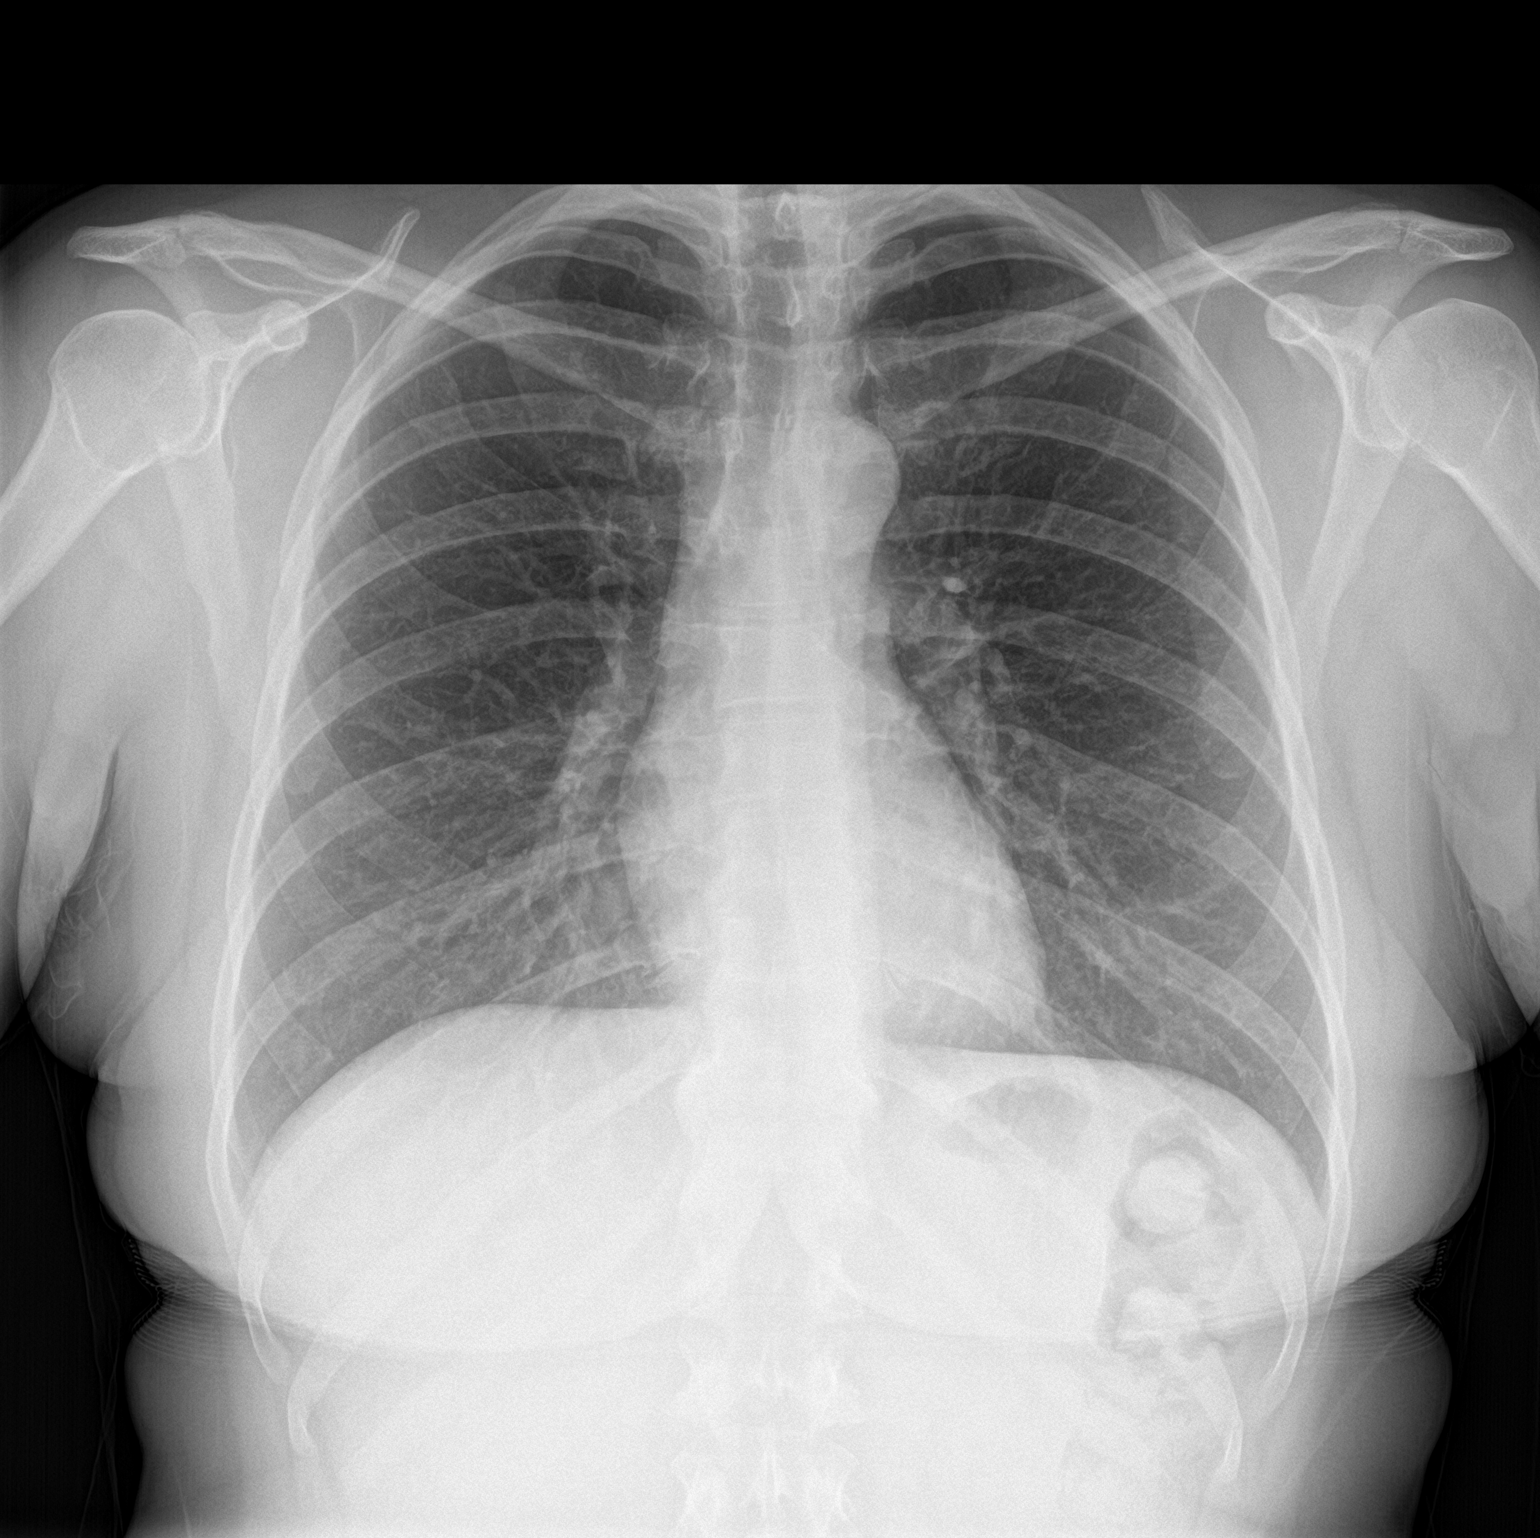

[chest lat]
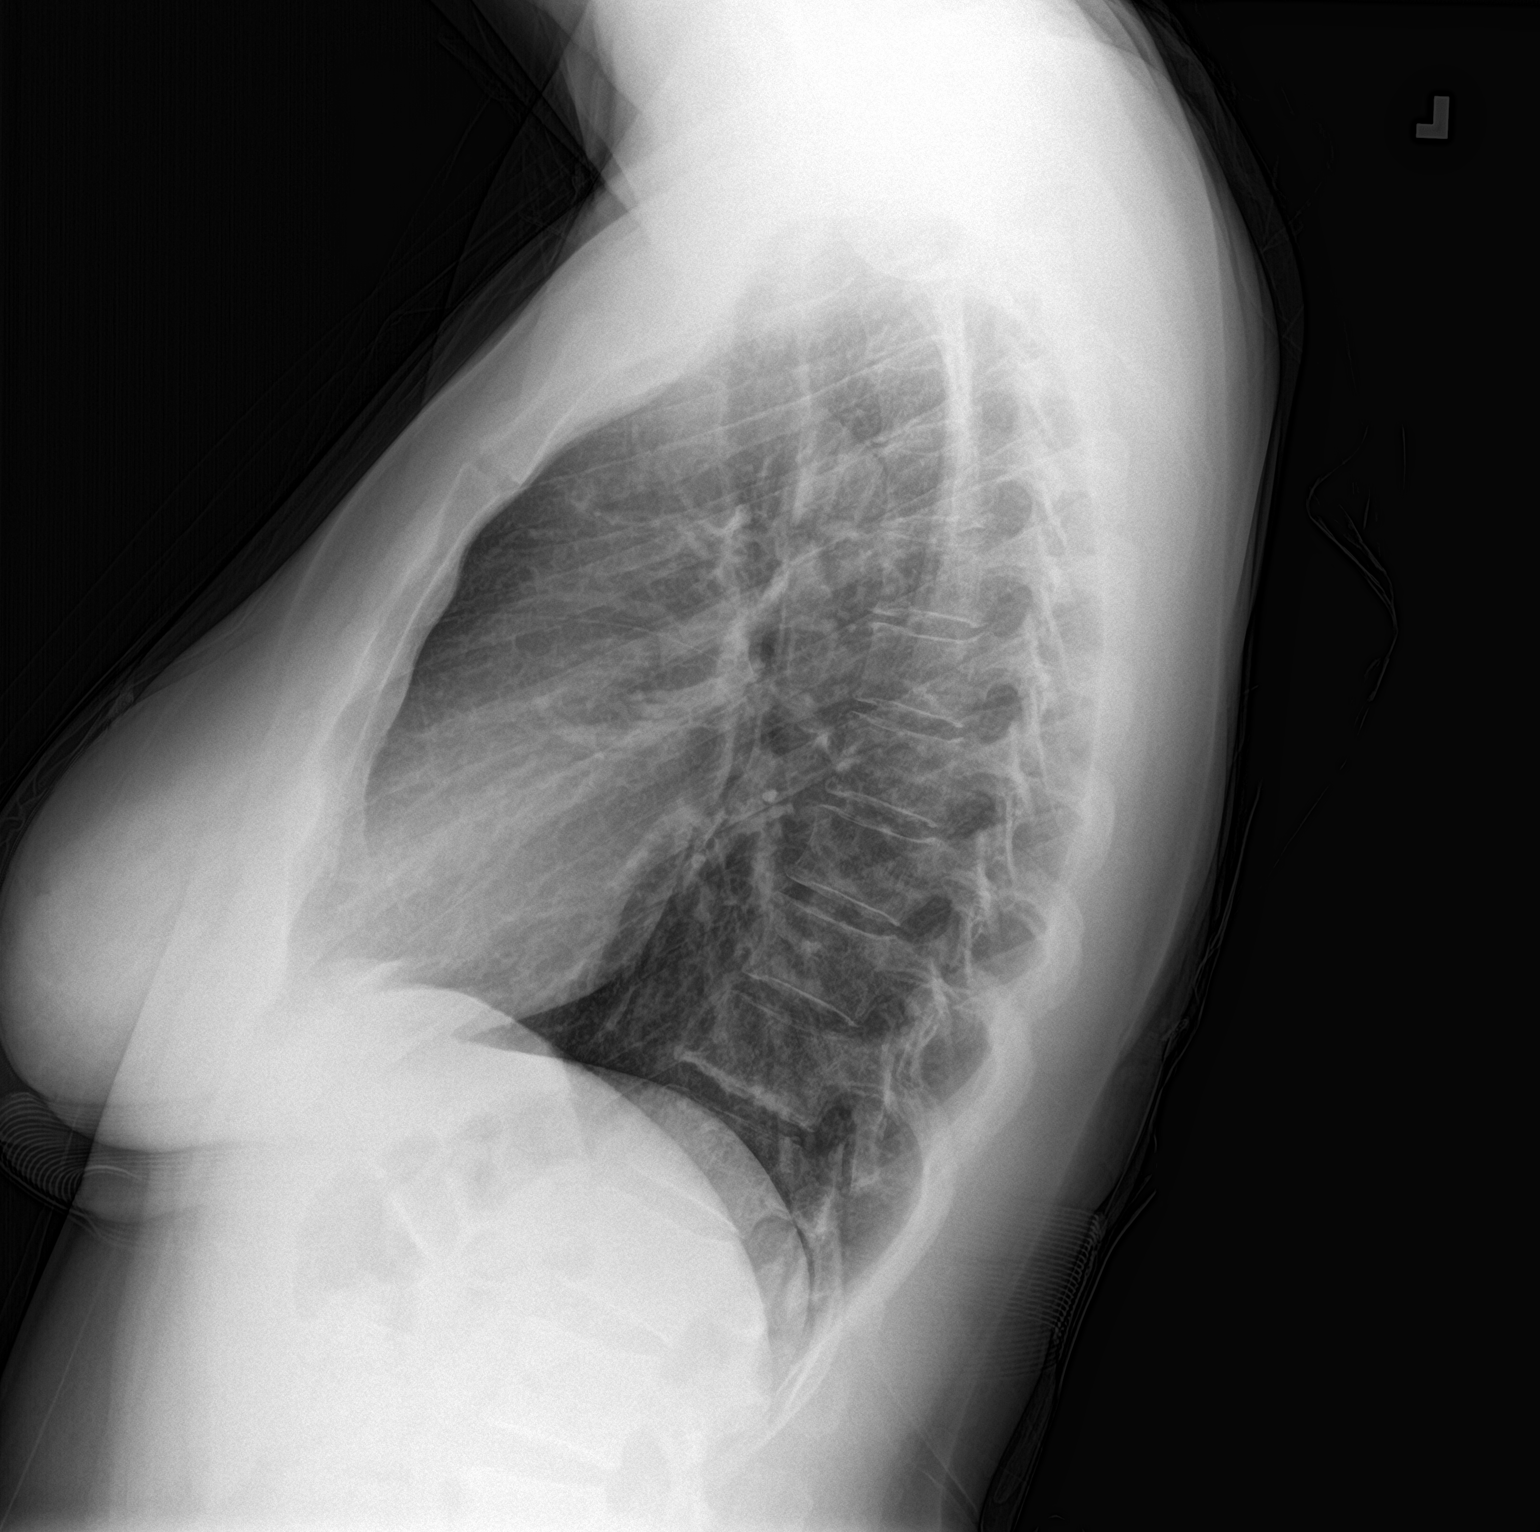

[2 of 2 positions shown; findings below may reference images not displayed]

FINDINGS: Mediastinum and hilar structures normal. Lungs are clear. Heart size
normal. No pleural effusion or pneumothorax. Thoracic spine
scoliosis.
IMPRESSION: No acute cardiopulmonary disease.

## 2018-10-16 ENCOUNTER — Encounter: Payer: Self-pay | Admitting: Sports Medicine

## 2018-10-16 ENCOUNTER — Other Ambulatory Visit: Payer: Self-pay

## 2018-10-16 ENCOUNTER — Ambulatory Visit: Payer: 59 | Admitting: Sports Medicine

## 2018-10-16 DIAGNOSIS — N921 Excessive and frequent menstruation with irregular cycle: Secondary | ICD-10-CM | POA: Diagnosis not present

## 2018-10-16 DIAGNOSIS — Z0000 Encounter for general adult medical examination without abnormal findings: Secondary | ICD-10-CM

## 2018-10-16 DIAGNOSIS — L723 Sebaceous cyst: Secondary | ICD-10-CM | POA: Diagnosis not present

## 2018-10-16 DIAGNOSIS — M51369 Other intervertebral disc degeneration, lumbar region without mention of lumbar back pain or lower extremity pain: Secondary | ICD-10-CM | POA: Insufficient documentation

## 2018-10-16 DIAGNOSIS — M5030 Other cervical disc degeneration, unspecified cervical region: Secondary | ICD-10-CM | POA: Diagnosis not present

## 2018-10-16 DIAGNOSIS — M5136 Other intervertebral disc degeneration, lumbar region: Secondary | ICD-10-CM | POA: Diagnosis not present

## 2018-10-16 MED ORDER — MELOXICAM 15 MG PO TABS: ORAL_TABLET | ORAL | 3 refills | Status: DC

## 2018-10-16 NOTE — Assessment & Plan Note (Signed)
Meloxicam, x-rays, formal physical therapy

## 2018-10-16 NOTE — Assessment & Plan Note (Signed)
Checking routine labs

## 2018-10-16 NOTE — Assessment & Plan Note (Signed)
3.1 cm, surgical excision with primary closure. Return in 1 week for incision check.

## 2018-10-16 NOTE — Assessment & Plan Note (Signed)
History of simple right and hemorrhagic left ovarian cyst. I am going to check some labs, she is having irregular cycles. I would also like another set of ultrasounds.

## 2018-10-16 NOTE — Patient Instructions (Signed)
Incision Care, Adult An incision is a surgical cut that is made through your skin. Most incisions are closed after surgery. Your incision may be closed with stitches (sutures), staples, skin glue, or adhesive strips. You may need to return to your health care provider to have sutures or staples removed. This may occur several days to several weeks after your surgery. The incision needs to be cared for properly to prevent infection. How to care for your incision Incision care   Follow instructions from your health care provider about how to take care of your incision. Make sure you: ? Wash your hands with soap and water before you change the bandage (dressing). If soap and water are not available, use hand sanitizer. ? Change your dressing as told by your health care provider. ? Leave sutures, skin glue, or adhesive strips in place. These skin closures may need to stay in place for 2 weeks or longer. If adhesive strip edges start to loosen and curl up, you may trim the loose edges. Do not remove adhesive strips completely unless your health care provider tells you to do that.  Check your incision area every day for signs of infection. Check for: ? More redness, swelling, or pain. ? More fluid or blood. ? Warmth. ? Pus or a bad smell.  Ask your health care provider how to clean the incision. This may include: ? Using mild soap and water. ? Using a clean towel to pat the incision dry after cleaning it. ? Applying a cream or ointment. Do this only as told by your health care provider. ? Covering the incision with a clean dressing.  Ask your health care provider when you can leave the incision uncovered.  Do not take baths, swim, or use a hot tub until your health care provider approves. Ask your health care provider if you can take showers. You may only be allowed to take sponge baths for bathing. Medicines  If you were prescribed an antibiotic medicine, cream, or ointment, take or apply the  antibiotic as told by your health care provider. Do not stop taking or applying the antibiotic even if your condition improves.  Take over-the-counter and prescription medicines only as told by your health care provider. General instructions  Limit movement around your incision to improve healing. ? Avoid straining, lifting, or exercise for the first month, or for as long as told by your health care provider. ? Follow instructions from your health care provider about returning to your normal activities. ? Ask your health care provider what activities are safe.  Protect your incision from the sun when you are outside for the first 6 months, or for as long as told by your health care provider. Apply sunscreen around the scar or cover it up.  Keep all follow-up visits as told by your health care provider. This is important. Contact a health care provider if:  Your have more redness, swelling, or pain around the incision.  You have more fluid or blood coming from the incision.  Your incision feels warm to the touch.  You have pus or a bad smell coming from the incision.  You have a fever or shaking chills.  You are nauseous or you vomit.  You are dizzy.  Your sutures or staples come undone. Get help right away if:  You have a red streak coming from your incision.  Your incision bleeds through the dressing and the bleeding does not stop with gentle pressure.  The edges of  your incision open up and separate.  You have severe pain.  You have a rash.  You are confused.  You faint.  You have trouble breathing and a fast heartbeat. This information is not intended to replace advice given to you by your health care provider. Make sure you discuss any questions you have with your health care provider. Document Released: 01/19/2005 Document Revised: 03/09/2016 Document Reviewed: 01/18/2016 Elsevier Interactive Patient Education  2019 [employer].

## 2018-10-16 NOTE — Progress Notes (Signed)
Subjective:    CC: Multiple issues  HPI: Irregular periods: Irregular menstruation with pain.  They occur about 2 weeks apart, she has a moderate amount of bleeding for several days, she feels as though it is more than usual.  We had imaged her in the past and did find a simple right and hemorrhagic left ovarian cysts.  She is interested in checking to see if she is perimenopausal.  Skin mass: Left-sided, belt line.  Painful.  Back pain, neck pain: She works in a flexed position for 12 hours a day, she has significant pain in her neck and her back, worse with flexion, worse with Valsalva, axial without radicular symptoms, no bowel or bladder dysfunction, saddle numbness, constitutional symptoms, trauma.  I reviewed the past medical history, family history, social history, surgical history, and allergies today and no changes were needed.  Please see the problem list section below in epic for further details.  Past Medical History: Past Medical History:  Diagnosis Date  . Obesity (BMI 30-39.9)    Past Surgical History: Past Surgical History:  Procedure Laterality Date  . ANKLE FRACTURE SURGERY     Social History: Social History   Socioeconomic History  . Marital status: Single    Spouse name: Not on file  . Number of children: Not on file  . Years of education: Not on file  . Highest education level: Not on file  Occupational History  . Occupation: Advice worker  Social Needs  . Financial resource strain: Not on file  . Food insecurity:    Worry: Not on file    Inability: Not on file  . Transportation needs:    Medical: Not on file    Non-medical: Not on file  Tobacco Use  . Smoking status: Former Smoker    Packs/day: 0.50    Years: 26.00    Pack years: 13.00    Types: Cigarettes  . Smokeless tobacco: Never Used  Substance and Sexual Activity  . Alcohol use: No    Alcohol/week: 0.0 standard drinks  . Drug use: No  . Sexual activity: Yes    Partners: Male  Lifestyle   . Physical activity:    Days per week: Not on file    Minutes per session: Not on file  . Stress: Not on file  Relationships  . Social connections:    Talks on phone: Not on file    Gets together: Not on file    Attends religious service: Not on file    Active member of club or organization: Not on file    Attends meetings of clubs or organizations: Not on file    Relationship status: Not on file  Other Topics Concern  . Not on file  Social History Narrative  . Not on file   Family History: Family History  Problem Relation Age of Onset  . Hyperlipidemia Mother   . Cancer Maternal Grandmother        pancreatic  . Heart attack Maternal Grandfather    Allergies: No Known Allergies Medications: See med rec.  Review of Systems: No fevers, chills, night sweats, weight loss, chest pain, or shortness of breath.   Objective:    General: Well Developed, well nourished, and in no acute distress.  Neuro: Alert and oriented x3, extra-ocular muscles intact, sensation grossly intact.  HEENT: Normocephalic, atraumatic, pupils equal round reactive to light, neck supple, no masses, no lymphadenopathy, thyroid nonpalpable.  Skin: Warm and dry, no rashes.  3.1 cm mass in the  left lower back Cardiac: Regular rate and rhythm, no murmurs rubs or gallops, no lower extremity edema.  Respiratory: Clear to auscultation bilaterally. Not using accessory muscles, speaking in full sentences. Neck: Negative spurling's Full neck range of motion Grip strength and sensation normal in bilateral hands Strength good C4 to T1 distribution No sensory change to C4 to T1 Reflexes normal Back Exam:  Inspection: Unremarkable  Motion: Flexion 45 deg, Extension 45 deg, Side Bending to 45 deg bilaterally,  Rotation to 45 deg bilaterally  SLR laying: Negative  XSLR laying: Negative  Palpable tenderness: None. FABER: negative. Sensory change: Gross sensation intact to all lumbar and sacral dermatomes.   Reflexes: 2+ at both patellar tendons, 2+ at achilles tendons, Babinski's downgoing.  Strength at foot  Plantar-flexion: 5/5 Dorsi-flexion: 5/5 Eversion: 5/5 Inversion: 5/5  Leg strength  Quad: 5/5 Hamstring: 5/5 Hip flexor: 5/5 Hip abductors: 5/5  Gait unremarkable.  Procedure:  Excision of 3.1 cm left lower back sebaceous cyst Risks, benefits, and alternatives explained and consent obtained. Time out conducted. Surface prepped with alcohol. 10cc lidocaine with epinephine infiltrated in a field block. Adequate anesthesia ensured. Area prepped and draped in a sterile fashion. Excision performed with: Using a #10 blade I made a linear incision, I then expressed the contents of the sebaceous cyst, and remove the entire wall.  I then closed the incision with a running subcuticular 3-0 Vicryl suture. Hemostasis achieved. Pt stable.  Impression and Recommendations:    Annual physical exam Checking routine labs  DDD (degenerative disc disease), cervical Meloxicam, x-rays, formal physical therapy  DDD (degenerative disc disease), lumbar Meloxicam, x-rays, formal physical therapy  Menometrorrhagia History of simple right and hemorrhagic left ovarian cyst. I am going to check some labs, she is having irregular cycles. I would also like another set of ultrasounds.  Sebaceous cyst 3.1 cm, surgical excision with primary closure. Return in 1 week for incision check.   ___________________________________________ Gwen Her. Dianah Field, M.D., ABFM., CAQSM. Primary Care and Sports Medicine [facility]  Adjunct Professor of [place] [employer] [facility] of Medicine

## 2018-10-20 DIAGNOSIS — Z0000 Encounter for general adult medical examination without abnormal findings: Secondary | ICD-10-CM | POA: Diagnosis not present

## 2018-10-20 DIAGNOSIS — N921 Excessive and frequent menstruation with irregular cycle: Secondary | ICD-10-CM | POA: Diagnosis not present

## 2018-10-23 ENCOUNTER — Ambulatory Visit (INDEPENDENT_AMBULATORY_CARE_PROVIDER_SITE_OTHER): Payer: 59

## 2018-10-23 ENCOUNTER — Encounter: Payer: Self-pay | Admitting: Sports Medicine

## 2018-10-23 ENCOUNTER — Ambulatory Visit (INDEPENDENT_AMBULATORY_CARE_PROVIDER_SITE_OTHER): Payer: 59 | Admitting: Sports Medicine

## 2018-10-23 ENCOUNTER — Other Ambulatory Visit: Payer: Self-pay

## 2018-10-23 DIAGNOSIS — M542 Cervicalgia: Secondary | ICD-10-CM

## 2018-10-23 DIAGNOSIS — L723 Sebaceous cyst: Secondary | ICD-10-CM

## 2018-10-23 DIAGNOSIS — M5030 Other cervical disc degeneration, unspecified cervical region: Secondary | ICD-10-CM

## 2018-10-23 DIAGNOSIS — M545 Low back pain: Secondary | ICD-10-CM

## 2018-10-23 DIAGNOSIS — M5136 Other intervertebral disc degeneration, lumbar region: Secondary | ICD-10-CM

## 2018-10-23 LAB — PROLACTIN: Prolactin: 11.5 ng/mL

## 2018-10-23 LAB — TSH: TSH: 3.55 mIU/L

## 2018-10-23 LAB — COMPREHENSIVE METABOLIC PANEL
AG Ratio: 1.7 (calc) (ref 1.0–2.5)
ALT: 10 U/L (ref 6–29)
AST: 13 U/L (ref 10–30)
Albumin: 4.4 g/dL (ref 3.6–5.1)
Alkaline phosphatase (APISO): 48 U/L (ref 31–125)
BUN: 20 mg/dL (ref 7–25)
CO2: 26 mmol/L (ref 20–32)
Calcium: 9.2 mg/dL (ref 8.6–10.2)
Chloride: 107 mmol/L (ref 98–110)
Creat: 0.89 mg/dL (ref 0.50–1.10)
Globulin: 2.6 g/dL (calc) (ref 1.9–3.7)
Glucose, Bld: 107 mg/dL — ABNORMAL HIGH (ref 65–99)
Potassium: 4.8 mmol/L (ref 3.5–5.3)
Sodium: 139 mmol/L (ref 135–146)
Total Bilirubin: 0.2 mg/dL (ref 0.2–1.2)
Total Protein: 7 g/dL (ref 6.1–8.1)

## 2018-10-23 LAB — HEMOGLOBIN A1C
Hgb A1c MFr Bld: 4.9 % of total Hgb (ref <5.7)
Mean Plasma Glucose: 94 (calc)
eAG (mmol/L): 5.2 (calc)

## 2018-10-23 LAB — LIPID PANEL W/REFLEX DIRECT LDL
Cholesterol: 156 mg/dL (ref <200)
HDL: 43 mg/dL — ABNORMAL LOW (ref >=50)
LDL Cholesterol (Calc): 90 mg/dL (calc)
Non-HDL Cholesterol (Calc): 113 mg/dL (calc) (ref <130)
Total CHOL/HDL Ratio: 3.6 (calc) (ref <5.0)
Triglycerides: 131 mg/dL (ref <150)

## 2018-10-23 LAB — ESTROGENS, TOTAL: Estrogen: 252.6 pg/mL

## 2018-10-23 LAB — PROGESTERONE: Progesterone: 5.2 ng/mL

## 2018-10-23 LAB — CBC
HCT: 41.9 % (ref 35.0–45.0)
Hemoglobin: 14.2 g/dL (ref 11.7–15.5)
MCH: 33.3 pg — ABNORMAL HIGH (ref 27.0–33.0)
MCHC: 33.9 g/dL (ref 32.0–36.0)
MCV: 98.1 fL (ref 80.0–100.0)
MPV: 11 fL (ref 7.5–12.5)
Platelets: 290 10*3/uL (ref 140–400)
RBC: 4.27 10*6/uL (ref 3.80–5.10)
RDW: 11.3 % (ref 11.0–15.0)
WBC: 7 10*3/uL (ref 3.8–10.8)

## 2018-10-23 LAB — VITAMIN D 25 HYDROXY (VIT D DEFICIENCY, FRACTURES): Vit D, 25-Hydroxy: 24 ng/mL — ABNORMAL LOW (ref 30–100)

## 2018-10-23 LAB — LUTEINIZING HORMONE: LH: 19.7 m[IU]/mL

## 2018-10-23 LAB — FOLLICLE STIMULATING HORMONE: FSH: 20.6 m[IU]/mL

## 2018-10-23 NOTE — Assessment & Plan Note (Signed)
Doing well, return as needed. I did trim a bit of protruding suture.

## 2018-10-23 NOTE — Progress Notes (Signed)
  Subjective: 1 week post surgical excision of a sebaceous cyst, doing well  Objective: General: Well-developed, well-nourished, and in no acute distress. Incision: Clean, dry, intact.  Assessment/plan:   Sebaceous cyst Doing well, return as needed. I did trim a bit of protruding suture.    ___________________________________________ Gwen Her. Dianah Field, M.D., ABFM., CAQSM. Primary Care and Sports Medicine [facility]  Adjunct Professor of [place] [employer] [facility] of Medicine

## 2018-12-05 DIAGNOSIS — R070 Pain in throat: Secondary | ICD-10-CM | POA: Diagnosis not present

## 2021-07-19 ENCOUNTER — Other Ambulatory Visit: Payer: Self-pay

## 2021-07-19 ENCOUNTER — Ambulatory Visit (INDEPENDENT_AMBULATORY_CARE_PROVIDER_SITE_OTHER): Payer: 59

## 2021-07-19 ENCOUNTER — Ambulatory Visit: Payer: 59 | Admitting: Sports Medicine

## 2021-07-19 DIAGNOSIS — M25512 Pain in left shoulder: Secondary | ICD-10-CM

## 2021-07-19 DIAGNOSIS — R21 Rash and other nonspecific skin eruption: Secondary | ICD-10-CM | POA: Diagnosis not present

## 2021-07-19 DIAGNOSIS — M7542 Impingement syndrome of left shoulder: Secondary | ICD-10-CM

## 2021-07-19 DIAGNOSIS — G8929 Other chronic pain: Secondary | ICD-10-CM

## 2021-07-19 DIAGNOSIS — G43009 Migraine without aura, not intractable, without status migrainosus: Secondary | ICD-10-CM | POA: Diagnosis not present

## 2021-07-19 DIAGNOSIS — E785 Hyperlipidemia, unspecified: Secondary | ICD-10-CM | POA: Diagnosis not present

## 2021-07-19 DIAGNOSIS — D72829 Elevated white blood cell count, unspecified: Secondary | ICD-10-CM

## 2021-07-19 DIAGNOSIS — Z0000 Encounter for general adult medical examination without abnormal findings: Secondary | ICD-10-CM

## 2021-07-19 MED ORDER — DEXAMETHASONE SODIUM PHOSPHATE 10 MG/ML IJ SOLN
10.0000 mg | Freq: Once | INTRAMUSCULAR | Status: AC
  Administered 2021-07-19: 10 mg via INTRAMUSCULAR

## 2021-07-19 MED ORDER — KETOROLAC TROMETHAMINE 30 MG/ML IJ SOLN
30.0000 mg | Freq: Once | INTRAMUSCULAR | Status: AC
  Administered 2021-07-19: 30 mg via INTRAMUSCULAR

## 2021-07-19 MED ORDER — TOPIRAMATE 50 MG PO TABS: ORAL_TABLET | ORAL | 3 refills | Status: DC

## 2021-07-19 NOTE — Assessment & Plan Note (Signed)
Unclear etiology, she has been having macular type rashes that are intensely itchy, she scratches them and then they become indurated, these occur frequently all over her body, currently over her ear, tells me she does not pick at it. No one else in the family has these. I am unsure of the etiology, the next time she gets 1 would like to take a look, we will also need to get some routine labs.

## 2021-07-19 NOTE — Progress Notes (Addendum)
° ° °  Procedures performed today:    None.  Independent interpretation of notes and tests performed by another provider:   None.  Brief History, Exam, Impression, and Recommendations:    Migraine headache without aura Currently having an active migraine, adding Toradol and Decadron intramuscular. She tells me she has headache days most days of the month, adding topiramate and she will diary her headache days afterwards. Return to see me in a month for this.  Skin rash Unclear etiology, she has been having macular type rashes that are intensely itchy, she scratches them and then they become indurated, these occur frequently all over her body, currently over her ear, tells me she does not pick at it. No one else in the family has these. I am unsure of the etiology, the next time she gets 1 would like to take a look, we will also need to get some routine labs.  Impingement syndrome, shoulder, left Pain over the deltoid, positive impingement signs on exam, adding x-rays, formal physical therapy, return in 4 to 6 weeks for this, injection if not better.  Hyperlipidemia LDL very elevated, adding Crestor 20, recheck in 2 to 3 months.  Leukocytosis Suspect leukemoid reaction, recheck CBC with differential in 1 month.    ___________________________________________ Gwen Her. Dianah Field, M.D., ABFM., CAQSM. Primary Care and [place] Instructor of [employer] of Medicine

## 2021-07-19 NOTE — Assessment & Plan Note (Signed)
Currently having an active migraine, adding Toradol and Decadron intramuscular. She tells me she has headache days most days of the month, adding topiramate and she will diary her headache days afterwards. Return to see me in a month for this.

## 2021-07-19 NOTE — Assessment & Plan Note (Signed)
Pain over the deltoid, positive impingement signs on exam, adding x-rays, formal physical therapy, return in 4 to 6 weeks for this, injection if not better.

## 2021-07-19 NOTE — Addendum Note (Signed)
Addended by: Andria Rhein L on: 07/19/2021 11:35 AM   Modules accepted: Orders

## 2021-07-21 DIAGNOSIS — D72829 Elevated white blood cell count, unspecified: Secondary | ICD-10-CM | POA: Insufficient documentation

## 2021-07-21 DIAGNOSIS — E785 Hyperlipidemia, unspecified: Secondary | ICD-10-CM | POA: Insufficient documentation

## 2021-07-21 LAB — COMPREHENSIVE METABOLIC PANEL
AG Ratio: 1.4 (calc) (ref 1.0–2.5)
ALT: 13 U/L (ref 6–29)
AST: 13 U/L (ref 10–35)
Albumin: 4.4 g/dL (ref 3.6–5.1)
Alkaline phosphatase (APISO): 47 U/L (ref 31–125)
BUN: 10 mg/dL (ref 7–25)
CO2: 25 mmol/L (ref 20–32)
Calcium: 9.9 mg/dL (ref 8.6–10.2)
Chloride: 106 mmol/L (ref 98–110)
Creat: 0.89 mg/dL (ref 0.50–0.99)
Globulin: 3.2 g/dL (calc) (ref 1.9–3.7)
Glucose, Bld: 115 mg/dL — ABNORMAL HIGH (ref 65–99)
Potassium: 5.3 mmol/L (ref 3.5–5.3)
Sodium: 138 mmol/L (ref 135–146)
Total Bilirubin: 0.4 mg/dL (ref 0.2–1.2)
Total Protein: 7.6 g/dL (ref 6.1–8.1)

## 2021-07-21 LAB — CBC
HCT: 44.4 % (ref 35.0–45.0)
Hemoglobin: 14.8 g/dL (ref 11.7–15.5)
MCH: 32 pg (ref 27.0–33.0)
MCHC: 33.3 g/dL (ref 32.0–36.0)
MCV: 96.1 fL (ref 80.0–100.0)
MPV: 10.6 fL (ref 7.5–12.5)
Platelets: 343 10*3/uL (ref 140–400)
RBC: 4.62 10*6/uL (ref 3.80–5.10)
RDW: 11.9 % (ref 11.0–15.0)
WBC: 21 10*3/uL — ABNORMAL HIGH (ref 3.8–10.8)

## 2021-07-21 LAB — LIPID PANEL
Cholesterol: 258 mg/dL — ABNORMAL HIGH (ref <200)
HDL: 52 mg/dL (ref >=50)
LDL Cholesterol (Calc): 180 mg/dL (calc) — ABNORMAL HIGH
Non-HDL Cholesterol (Calc): 206 mg/dL (calc) — ABNORMAL HIGH (ref <130)
Total CHOL/HDL Ratio: 5 (calc) — ABNORMAL HIGH (ref <5.0)
Triglycerides: 123 mg/dL (ref <150)

## 2021-07-21 LAB — TSH: TSH: 0.63 mIU/L

## 2021-07-21 MED ORDER — ROSUVASTATIN CALCIUM 20 MG PO TABS: 20.0000 mg | ORAL_TABLET | Freq: Every day | ORAL | 3 refills | Status: DC

## 2021-07-21 NOTE — Assessment & Plan Note (Signed)
Suspect leukemoid reaction, recheck CBC with differential in 1 month.

## 2021-07-21 NOTE — Assessment & Plan Note (Signed)
LDL very elevated, adding Crestor 20, recheck in 2 to 3 months.

## 2021-07-21 NOTE — Addendum Note (Signed)
Addended by: Silverio Decamp on: 07/21/2021 10:58 AM   Modules accepted: Orders

## 2021-08-04 ENCOUNTER — Ambulatory Visit: Payer: 59 | Attending: Sports Medicine | Admitting: Physical Therapy

## 2021-08-16 ENCOUNTER — Encounter: Payer: Self-pay | Admitting: Physical Therapy

## 2021-08-16 ENCOUNTER — Other Ambulatory Visit: Payer: Self-pay

## 2021-08-16 ENCOUNTER — Ambulatory Visit (INDEPENDENT_AMBULATORY_CARE_PROVIDER_SITE_OTHER): Payer: 59 | Admitting: Sports Medicine

## 2021-08-16 ENCOUNTER — Ambulatory Visit: Payer: 59 | Attending: Sports Medicine | Admitting: Physical Therapy

## 2021-08-16 VITALS — BP 111/73 | HR 84 | Wt 187.4 lb

## 2021-08-16 DIAGNOSIS — E785 Hyperlipidemia, unspecified: Secondary | ICD-10-CM

## 2021-08-16 DIAGNOSIS — G43009 Migraine without aura, not intractable, without status migrainosus: Secondary | ICD-10-CM

## 2021-08-16 DIAGNOSIS — R29898 Other symptoms and signs involving the musculoskeletal system: Secondary | ICD-10-CM | POA: Insufficient documentation

## 2021-08-16 DIAGNOSIS — Z0000 Encounter for general adult medical examination without abnormal findings: Secondary | ICD-10-CM

## 2021-08-16 DIAGNOSIS — M7542 Impingement syndrome of left shoulder: Secondary | ICD-10-CM | POA: Insufficient documentation

## 2021-08-16 DIAGNOSIS — G8929 Other chronic pain: Secondary | ICD-10-CM | POA: Diagnosis present

## 2021-08-16 DIAGNOSIS — D72829 Elevated white blood cell count, unspecified: Secondary | ICD-10-CM

## 2021-08-16 DIAGNOSIS — M6281 Muscle weakness (generalized): Secondary | ICD-10-CM | POA: Insufficient documentation

## 2021-08-16 DIAGNOSIS — M25512 Pain in left shoulder: Secondary | ICD-10-CM | POA: Insufficient documentation

## 2021-08-16 DIAGNOSIS — Z23 Encounter for immunization: Secondary | ICD-10-CM | POA: Diagnosis not present

## 2021-08-16 DIAGNOSIS — Z1211 Encounter for screening for malignant neoplasm of colon: Secondary | ICD-10-CM

## 2021-08-16 MED ORDER — TOPIRAMATE 50 MG PO TABS: 50.0000 mg | ORAL_TABLET | Freq: Every day | ORAL | 3 refills | Status: DC

## 2021-08-16 MED ORDER — ROSUVASTATIN CALCIUM 20 MG PO TABS: 20.0000 mg | ORAL_TABLET | Freq: Every day | ORAL | 3 refills | Status: DC

## 2021-08-16 NOTE — Patient Instructions (Signed)
Access Code: [email or web address] URL: [email or web address] Date: 08/16/2021 Prepared by: Isabelle Course  Exercises Seated Scapular Retraction - 1 x daily - 7 x weekly - 2 sets - 10 reps Supine Shoulder Horizontal Abduction with Resistance - 1 x daily - 7 x weekly - 2 sets - 10 reps Standing Shoulder Row with Anchored Resistance - 1 x daily - 7 x weekly - 2 sets - 10 reps

## 2021-08-16 NOTE — Addendum Note (Signed)
Addended by: Gust Brooms on: 08/16/2021 09:57 AM   Modules accepted: Orders

## 2021-08-16 NOTE — Assessment & Plan Note (Signed)
Topamax was highly effective, she is really not having any more headaches.

## 2021-08-16 NOTE — Therapy (Signed)
[facility]  [address] [place], [place], [address] Phone: [phone]   Fax:  [phone]  Physical Therapy Evaluation  Patient Details  Name: Toria Monte MRN: [id number] Date of Birth: 1976-03-19 Referring Provider (PT): Thekkekandam   Encounter Date: 08/16/2021   PT End of Session - 08/16/21 1113     Visit Number 1    Number of Visits 12    Date for PT Re-Evaluation 09/27/21    Authorization Type UHC    PT Start Time 3474    PT Stop Time 1105    PT Time Calculation (min) 50 min    Activity Tolerance Patient tolerated treatment well    Behavior During Therapy [facility] for tasks assessed/performed             Past Medical History:  Diagnosis Date   Obesity (BMI 30-39.9)     Past Surgical History:  Procedure Laterality Date   ANKLE FRACTURE SURGERY      There were no vitals filed for this visit.    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 She states that about 2 years ago she tore a muscle in her Lt shoulder but was able to continue to work. Lt shoulder has always hurt since the injury. Pt transitioned to a job that requires less lifting but she still occasionally has to maneuver heavy items. Pain increases with sleeping on Lt shoulder, lifting Lt UE overhead, pulling on clothes and with heavy work. Pain eases when out of position    Limitations Lifting    Diagnostic tests x ray    Patient Stated Goals decrease pain    Currently in Pain? Yes    Pain Score 1     Pain Location Shoulder    Pain Orientation Left    Pain Descriptors / Indicators Aching;Sore;Sharp    Pain Onset More than a month ago    Pain Frequency Intermittent    Aggravating Factors  lifting Lt UE, sleeping on Lt UE    Pain Relieving Factors out of position    Effect of Pain on Daily Activities difficulty donning sports bras, shirts                [facility] PT  Assessment - 08/16/21 0001       Assessment   Medical Diagnosis impingement of Lt shoulder    Referring Provider (PT) Thekkekandam    Onset Date/Surgical Date 08/17/19    Hand Dominance Right    Next MD Visit 08/28/21    Prior Therapy none      Precautions   Precautions None      Restrictions   Weight Bearing Restrictions No      Balance Screen   Has the patient fallen in the past 6 months No      Prior Function   Level of Independence Independent    Vocation Requirements works at [employer]. some desk work and some physical      Observation/Other Assessments   Focus on Therapeutic Outcomes (FOTO)  58      ROM / Strength   AROM / PROM / Strength AROM;Strength      AROM   Overall AROM Comments Rt shoulder ROM WFL    AROM Assessment Site Shoulder    Right/Left Shoulder Left    Left Shoulder Flexion 128 Degrees    Left Shoulder ABduction 60 Degrees  Left Shoulder Internal Rotation 80 Degrees    Left Shoulder External Rotation 54 Degrees      Strength   Strength Assessment Site Shoulder    Right/Left Shoulder Right;Left    Right Shoulder Flexion 4+/5    Right Shoulder ABduction 4+/5    Right Shoulder Internal Rotation 4+/5    Right Shoulder External Rotation 4+/5    Left Shoulder Flexion 4-/5    Left Shoulder ABduction 4-/5    Left Shoulder Internal Rotation 4-/5    Left Shoulder External Rotation 3-/5      Palpation   Palpation comment Lt GH jt hypermobile with inferior and A/P glides, TTP Lt anterior and lateral shoulder mm      Special Tests   Other special tests empty can (+) Lt UE                        Objective measurements completed on examination: See above findings.       [facility] Adult PT Treatment/Exercise - 08/16/21 0001       Exercises   Exercises Shoulder      Shoulder Exercises: Supine   Horizontal ABduction 10 reps    Theraband Level (Shoulder Horizontal ABduction) Level 2 (Red)      Shoulder Exercises: Seated   Other  Seated Exercises scap squeeze x 10      Shoulder Exercises: Standing   Row 10 reps    Theraband Level (Shoulder Row) Level 2 (Red)      Modalities   Modalities Cryotherapy;Electrical Stimulation      Cryotherapy   Number Minutes Cryotherapy 10 Minutes    Cryotherapy Location Shoulder    Type of Cryotherapy Ice pack      Electrical Stimulation   Electrical Stimulation Location Lt shoulder    Electrical Stimulation Action TENS    Electrical Stimulation Parameters to tolerance    Electrical Stimulation Goals Pain                     PT Education - 08/16/21 1050     Education Details PT POC and goals, HEP, TENS    Person(s) Educated Patient    Methods Explanation;Demonstration;Handout    Comprehension Verbalized understanding;Returned demonstration                 PT Long Term Goals - 08/16/21 1120       PT LONG TERM GOAL #1   Title Pt will be independent in HEP    Time 6    Period Weeks    Status New    Target Date 09/27/21      PT LONG TERM GOAL #2   Title Pt will improve FOTO to >= 71 to demo improved functional mobility    Time 6    Period Weeks    Status New    Target Date 09/27/21      PT LONG TERM GOAL #3   Title Pt will improve Lt shoulder strength to 4+/5 to improve tolerance to work activities    Time 6    Period Weeks    Status New    Target Date 09/27/21      PT LONG TERM GOAL #4   Title Pt will be able to place items in overhead cabinet with pain <= 1/10    Time 6    Period Weeks    Status New    Target Date 09/27/21  Plan - 08/16/21 1113     Clinical Impression Statement Pt is a 46 y/o female with 2 year history of Lt shoulder pain. Pt presents with decreased ROM, impaired strength, hypermobility and decreased functional mobility. Pt will benefit from skilled PT to address deficits and improve functional mobility.    Personal Factors and Comorbidities Time since onset of  injury/illness/exacerbation;Past/Current Experience;Profession    Examination-Activity Limitations Lift;Carry;Reach Overhead    Examination-Participation Restrictions Community Activity;Cleaning;Occupation;Yard Work    Stability/Clinical Decision Making Stable/Uncomplicated    Clinical Decision Making Low    Rehab Potential Good    PT Frequency 2x / week    PT Duration 6 weeks    PT Treatment/Interventions Cryotherapy;Electrical Stimulation;Iontophoresis 26m/ml Dexamethasone;Moist Heat;Therapeutic exercise;Patient/family education;Therapeutic activities;Neuromuscular re-education;Manual techniques;Taping;Dry needling;Vasopneumatic Device;Passive range of motion    PT Next Visit Plan assess HEP, progress postural strength, modalities as needed    PT Home Exercise Plan [email or web address]   Consulted and Agree with Plan of Care Patient             Patient will benefit from skilled therapeutic intervention in order to improve the following deficits and impairments:  Pain, Impaired UE functional use, Decreased strength, Decreased activity tolerance, Decreased range of motion  Visit Diagnosis: Muscle weakness (generalized) - Plan: PT plan of care cert/re-cert  Chronic left shoulder pain - Plan: PT plan of care cert/re-cert  Other symptoms and signs involving the musculoskeletal system - Plan: PT plan of care cert/re-cert     Problem List Patient Active Problem List   Diagnosis Date Noted   Hyperlipidemia 07/21/2021   Leukocytosis 07/21/2021   Impingement syndrome, shoulder, left 07/19/2021   DDD (degenerative disc disease), cervical 10/16/2018   DDD (degenerative disc disease), lumbar 10/16/2018   Menometrorrhagia 10/16/2018   Migraine headache without aura 11/15/2015   Annual physical exam 09/27/2014    Dereona Kolodny, PT 08/16/2021, 11:25 AM  [facility] [address]Phone: [phone]  Fax:   [phone] Name: ATosha BelgardeMRN: [id number] of Birth: [birth year]

## 2021-08-16 NOTE — Progress Notes (Signed)
Subjective:    CC: Annual Physical Exam  HPI:  This patient is here for their annual physical  I reviewed the past medical history, family history, social history, surgical history, and allergies today and no changes were needed.  Please see the problem list section below in epic for further details.  Past Medical History: Past Medical History:  Diagnosis Date   Obesity (BMI 30-39.9)    Past Surgical History: Past Surgical History:  Procedure Laterality Date   ANKLE FRACTURE SURGERY     Social History: Social History   Socioeconomic History   Marital status: Single    Spouse name: Not on file   Number of children: Not on file   Years of education: Not on file   Highest education level: Not on file  Occupational History   Occupation: Advice worker  Tobacco Use   Smoking status: Former    Packs/day: 0.50    Years: 26.00    Pack years: 13.00    Types: Cigarettes   Smokeless tobacco: Never  Substance and Sexual Activity   Alcohol use: No    Alcohol/week: 0.0 standard drinks   Drug use: No   Sexual activity: Yes    Partners: Male  Other Topics Concern   Not on file  Social History Narrative   Not on file   Social Determinants of Health   Financial Resource Strain: Not on file  Food Insecurity: Not on file  Transportation Needs: Not on file  Physical Activity: Not on file  Stress: Not on file  Social Connections: Not on file   Family History: Family History  Problem Relation Age of Onset   Hyperlipidemia Mother    Cancer Maternal Grandmother        pancreatic   Heart attack Maternal Grandfather    Allergies: No Known Allergies Medications: See med rec.  Review of Systems: No headache, visual changes, nausea, vomiting, diarrhea, constipation, dizziness, abdominal pain, skin rash, fevers, chills, night sweats, swollen lymph nodes, weight loss, chest pain, body aches, joint swelling, muscle aches, shortness of breath, mood changes, visual or auditory  hallucinations.  Objective:    General: Well Developed, well nourished, and in no acute distress.  Neuro: Alert and oriented x3, extra-ocular muscles intact, sensation grossly intact. Cranial nerves II through XII are intact, motor, sensory, and coordinative functions are all intact. HEENT: Normocephalic, atraumatic, pupils equal round reactive to light, neck supple, no masses, no lymphadenopathy, thyroid nonpalpable. Oropharynx, nasopharynx, external ear canals are unremarkable. Skin: Warm and dry, no rashes noted.  Cardiac: Regular rate and rhythm, no murmurs rubs or gallops.  Respiratory: Clear to auscultation bilaterally. Not using accessory muscles, speaking in full sentences.  Abdominal: Soft, nontender, nondistended, positive bowel sounds, no masses, no organomegaly.  Musculoskeletal: Shoulder, elbow, wrist, hip, knee, ankle stable, and with full range of motion.  Impression and Recommendations:    The patient was counselled, risk factors were discussed, anticipatory guidance given.  Annual physical exam Annual physical as above, Tdap today, declines flu shot, Cologuard, she does have Pap smear scheduled in a few weeks.  Hyperlipidemia LDL was very elevated, we started Crestor a month ago, recheck fasting lipids in 2 months.  Leukocytosis WBC count 21,000, she was diagnosed with COVID right after we got her labs, we can recheck her CBC in 2 months when she gets her lipids.  Migraine headache without aura Topamax was highly effective, she is really not having any more headaches.   ___________________________________________ Gwen Her. Dianah Field, M.D., ABFM., CAQSM.  Primary Care and Sports Medicine [facility]  Adjunct Professor of [employer] [facility] [employer]

## 2021-08-16 NOTE — Assessment & Plan Note (Signed)
WBC count 21,000, she was diagnosed with COVID right after we got her labs, we can recheck her CBC in 2 months when she gets her lipids.

## 2021-08-16 NOTE — Assessment & Plan Note (Signed)
LDL was very elevated, we started Crestor a month ago, recheck fasting lipids in 2 months.

## 2021-08-16 NOTE — Assessment & Plan Note (Signed)
Annual physical as above, Tdap today, declines flu shot, Cologuard, she does have Pap smear scheduled in a few weeks.

## 2021-08-18 ENCOUNTER — Encounter: Payer: Self-pay | Admitting: Physical Therapy

## 2021-08-18 ENCOUNTER — Ambulatory Visit: Payer: 59 | Admitting: Physical Therapy

## 2021-08-18 ENCOUNTER — Other Ambulatory Visit: Payer: Self-pay

## 2021-08-18 DIAGNOSIS — M6281 Muscle weakness (generalized): Secondary | ICD-10-CM | POA: Diagnosis not present

## 2021-08-18 DIAGNOSIS — R29898 Other symptoms and signs involving the musculoskeletal system: Secondary | ICD-10-CM

## 2021-08-18 DIAGNOSIS — G8929 Other chronic pain: Secondary | ICD-10-CM

## 2021-08-18 DIAGNOSIS — M25512 Pain in left shoulder: Secondary | ICD-10-CM

## 2021-08-18 NOTE — Therapy (Signed)
[place], [place], [address] Phone: [phone]   Fax:  [phone]  Physical Therapy Treatment  Patient Details  Name: Colleen Miller MRN: [id number] Date of Birth: 1975/07/31 Referring Provider (PT): Thekkekandam   Encounter Date: 08/18/2021   PT End of Session - 08/18/21 1127     Visit Number 2    Number of Visits 12    Date for PT Re-Evaluation 09/27/21    Authorization Type UHC    PT Start Time 1103    PT Stop Time 1149    PT Time Calculation (min) 46 min    Activity Tolerance Patient tolerated treatment well    Behavior During Therapy [facility] for tasks assessed/performed             Past Medical History:  Diagnosis Date   Obesity (BMI 30-39.9)     Past Surgical History:  Procedure Laterality Date   ANKLE FRACTURE SURGERY      There were no vitals filed for this visit.   Subjective Assessment - 08/18/21 1108     Subjective Pt reports that her Lt shoulder feels about the same as last visit.  She hasn't performed the HEP exercises yet.    Limitations Lifting    Diagnostic tests x ray    Patient Stated Goals decrease pain    Currently in Pain? No/denies    Pain Score 0-No pain   no pain at rest, up to 8/67 with certain motions.   Pain Location Shoulder    Pain Orientation Left;Anterior    Pain Onset More than a month ago                [facility] PT Assessment - 08/18/21 0001       Assessment   Medical Diagnosis impingement of Lt shoulder    Referring Provider (PT) Thekkekandam    Onset Date/Surgical Date 08/17/19    Hand Dominance Right    Next MD Visit 08/28/21    Prior Therapy none                           [facility] Adult PT Treatment/Exercise - 08/18/21 0001       Shoulder Exercises: Supine   Other Supine Exercises hooklying on green wide pool noodle:  arms abdt 90 for pec stretch x 30 sec x 3, trial of flexion (painful at available end range)       Shoulder Exercises: Standing   External Rotation Both;10 reps;Strengthening;Theraband   2 sets   Theraband Level (Shoulder External Rotation) Level 2 (Red)    Row Strengthening;Both;10 reps;Theraband   3 sec pause in retraction., 2 sets   Theraband Level (Shoulder Row) Level 2 (Red)    Other Standing Exercises scap squeeze x 5 sec x 5;  W's x 5 sec x 3      Shoulder Exercises: ROM/Strengthening   UBE (Upper Arm Bike) L1: 1 min forward, 20 sec backward (painful), then 1 min forward again.      Shoulder Exercises: Stretch   Other Shoulder Stretches mid level doorway stretch x 15 sec x 3 (limited tolerance); biceps brachii stretch holding door frame x 15 sec - switched to holding counter x 15 sec x 3, (improved tolerance)      Manual Therapy   Manual Therapy Soft tissue mobilization;Taping    Soft tissue mobilization IASTM to Lt deltoid, biceps brachii, pec major, and post deltoid to decrease fascial restrictions  and improve mobility.    [place] I strip of reg Rock tape applied to ant arm - Lt biceps brachi with 2 perpendicular strips at areas of tightness; to decompress tissue and increase proprioception.                          PT Long Term Goals - 08/16/21 1120       PT LONG TERM GOAL #1   Title Pt will be independent in HEP    Time 6    Period Weeks    Status New    Target Date 09/27/21      PT LONG TERM GOAL #2   Title Pt will improve FOTO to >= 71 to demo improved functional mobility    Time 6    Period Weeks    Status New    Target Date 09/27/21      PT LONG TERM GOAL #3   Title Pt will improve Lt shoulder strength to 4+/5 to improve tolerance to work activities    Time 6    Period Weeks    Status New    Target Date 09/27/21      PT LONG TERM GOAL #4   Title Pt will be able to place items in overhead cabinet with pain <= 1/10    Time 6    Period Weeks    Status New    Target Date 09/27/21                    Plan - 08/18/21 1125     Clinical Impression Statement Pt has limited tolerance for doorway stretch at this time.  Tolerated bicep brachii stretch holding counter.  Encouraged pt to hold off on team stretches for LUE at work, until Lt shoulder is less painful.   Modified HEP to tolerance.  Goals are ongoing.    Personal Factors and Comorbidities Time since onset of injury/illness/exacerbation;Past/Current Experience;Profession    Examination-Activity Limitations Lift;Carry;Reach Overhead    Examination-Participation Restrictions Community Activity;Cleaning;Occupation;Yard Work    Stability/Clinical Decision Making Stable/Uncomplicated    Rehab Potential Good    PT Frequency 2x / week    PT Duration 6 weeks    PT Treatment/Interventions Cryotherapy;Electrical Stimulation;Iontophoresis 26m/ml Dexamethasone;Moist Heat;Therapeutic exercise;Patient/family education;Therapeutic activities;Neuromuscular re-education;Manual techniques;Taping;Dry needling;Vasopneumatic Device;Passive range of motion    PT Next Visit Plan assess HEP, progress postural strength, modalities as needed    PT Home Exercise Plan [email or web address]   Consulted and Agree with Plan of Care Patient             Patient will benefit from skilled therapeutic intervention in order to improve the following deficits and impairments:  Pain, Impaired UE functional use, Decreased strength, Decreased activity tolerance, Decreased range of motion  Visit Diagnosis: Muscle weakness (generalized)  Chronic left shoulder pain  Other symptoms and signs involving the musculoskeletal system     Problem List Patient Active Problem List   Diagnosis Date Noted   Hyperlipidemia 07/21/2021   Leukocytosis 07/21/2021   Impingement syndrome, shoulder, left 07/19/2021   DDD (degenerative disc disease), cervical 10/16/2018   DDD (degenerative disc disease), lumbar 10/16/2018   Menometrorrhagia 10/16/2018   Migraine headache  without aura 11/15/2015   Annual physical exam 09/27/2014   JKerin Perna PTA 08/18/21 1:05 PM  [place] [address] [address]Phone: [phone]  Fax:  [phone]  Name: Colleen Miller MRN: [id number] Date of Birth: Feb 20, 1976

## 2021-08-18 NOTE — Therapy (Signed)
[place], [place], [address] Phone: [phone]   Fax:  [phone]  Physical Therapy Treatment  Patient Details  Name: Colleen Miller MRN: [id number] Date of Birth: [birth year] Referring Provider (PT): Thekkekandam   Encounter Date: 08/18/2021   PT End of Session - 08/18/21 1127     Visit Number 2    Number of Visits 12    Date for PT Re-Evaluation 09/27/21    Authorization Type UHC    PT Start Time 1103    PT Stop Time 1149    PT Time Calculation (min) 46 min    Activity Tolerance Patient tolerated treatment well    Behavior During Therapy [facility] for tasks assessed/performed             Past Medical History:  Diagnosis Date   Obesity (BMI 30-39.9)     Past Surgical History:  Procedure Laterality Date   ANKLE FRACTURE SURGERY      There were no vitals filed for this visit.   Subjective Assessment - 08/18/21 1108     Subjective Pt reports that her Lt shoulder feels about the same as last visit.  She hasn't performed the HEP exercises yet.    Limitations Lifting    Diagnostic tests x ray    Patient Stated Goals decrease pain    Currently in Pain? No/denies    Pain Score 0-No pain   no pain at rest, up to 5/63 with certain motions.   Pain Location Shoulder    Pain Orientation Left;Anterior    Pain Onset More than a month ago             [facility] PT Assessment - 08/18/21 0001       Assessment   Medical Diagnosis impingement of Lt shoulder    Referring Provider (PT) Thekkekandam    Onset Date/Surgical Date 08/17/19    Hand Dominance Right    Next MD Visit 08/28/21    Prior Therapy none               [facility] Adult PT Treatment/Exercise - 08/18/21 0001       Shoulder Exercises: Supine   Other Supine Exercises hooklying on green wide pool noodle:  arms abdt 90 for pec stretch x 30 sec x 3, trial of flexion (painful at available end range)      Shoulder Exercises: Standing    External Rotation Both;10 reps;Strengthening;Theraband   2 sets   Theraband Level (Shoulder External Rotation) Level 2 (Red)    Row Strengthening;Both;10 reps;Theraband   3 sec pause in retraction.     Manual Therapy   Manual Therapy Soft tissue mobilization;Taping    Soft tissue mobilization IASTM to Lt deltoid, biceps brachii, pec major, and post deltoid to decrease fascial restrictions and improve mobility.    [place] I strip of reg Rock tape applied to ant arm - Lt biceps brachi with 2 perpendicular strips at areas of tightness; to decompress tissue and increase proprioception.                          PT Long Term Goals - 08/16/21 1120       PT LONG TERM GOAL #1   Title Pt will be independent in HEP    Time 6    Period Weeks    Status New    Target Date  09/27/21      PT LONG TERM GOAL #2   Title Pt will improve FOTO to >= 71 to demo improved functional mobility    Time 6    Period Weeks    Status New    Target Date 09/27/21      PT LONG TERM GOAL #3   Title Pt will improve Lt shoulder strength to 4+/5 to improve tolerance to work activities    Time 6    Period Weeks    Status New    Target Date 09/27/21      PT LONG TERM GOAL #4   Title Pt will be able to place items in overhead cabinet with pain <= 1/10    Time 6    Period Weeks    Status New    Target Date 09/27/21                   Plan - 08/18/21 1125     Clinical Impression Statement Pt has limited tolerance for doorway stretch at this time.  Tolerated bicep brachii stretch holding counter.  Encouraged pt to hold off on team stretches for LUE at work, until Lt shoulder is less painful.   Modified HEP to tolerance.  Goals are ongoing.    Personal Factors and Comorbidities Time since onset of injury/illness/exacerbation;Past/Current Experience;Profession    Examination-Activity Limitations Lift;Carry;Reach Overhead     Examination-Participation Restrictions Community Activity;Cleaning;Occupation;Yard Work    Stability/Clinical Decision Making Stable/Uncomplicated    Rehab Potential Good    PT Frequency 2x / week    PT Duration 6 weeks    PT Treatment/Interventions Cryotherapy;Electrical Stimulation;Iontophoresis 73m/ml Dexamethasone;Moist Heat;Therapeutic exercise;Patient/family education;Therapeutic activities;Neuromuscular re-education;Manual techniques;Taping;Dry needling;Vasopneumatic Device;Passive range of motion    PT Next Visit Plan assess HEP, progress postural strength, modalities as needed    PT Home Exercise Plan [email or web address]   Consulted and Agree with Plan of Care Patient             Patient will benefit from skilled therapeutic intervention in order to improve the following deficits and impairments:  Pain, Impaired UE functional use, Decreased strength, Decreased activity tolerance, Decreased range of motion  Visit Diagnosis: Muscle weakness (generalized)  Chronic left shoulder pain  Other symptoms and signs involving the musculoskeletal system     Problem List Patient Active Problem List   Diagnosis Date Noted   Hyperlipidemia 07/21/2021   Leukocytosis 07/21/2021   Impingement syndrome, shoulder, left 07/19/2021   DDD (degenerative disc disease), cervical 10/16/2018   DDD (degenerative disc disease), lumbar 10/16/2018   Menometrorrhagia 10/16/2018   Migraine headache without aura 11/15/2015   Annual physical exam 09/27/2014    CShelbie Hutching2/09/2021, 12:58 PM  [place] [address]Phone: [phone]  Fax:  [phone] Name: ADalila ArcaMRN: [id number] of Birth: [birth year]

## 2021-08-18 NOTE — Patient Instructions (Addendum)
Kinesiology tape What is kinesiology tape?  There are many brands of kinesiology tape.  KTape, [employer], [employer], Dynamic tape, to name a few. It is an elasticized tape designed to support the bodys natural healing process. This tape provides stability and support to muscles and joints without restricting motion. It can also help decrease swelling in the area of application. How does it work? The tape microscopically lifts and decompresses the skin to allow for drainage of lymph (swelling) to flow away from area, reducing inflammation.  The tape has the ability to help re-educate the neuromuscular system by targeting specific receptors in the skin.  The presence of the tape increases the bodys awareness of posture and body mechanics.  Do not use with: Open wounds Skin lesions Adhesive allergies Safe removal of the tape: In some rare cases, mild/moderate skin irritation can occur.  This can include redness, itchiness, or hives. If this occurs, immediately remove tape and consult your primary care physician if symptoms are severe or do not resolve within 2 days.  To remove tape safely, hold nearby skin with one hand and gentle roll tape down with other hand.  You can apply oil or conditioner to tape while in shower prior to removal to loosen adhesive.  DO NOT swiftly rip tape off like a band-aid, as this could cause skin tears and additional skin irritation.   Access Code: [email or web address] URL: [email or web address] Date: 08/18/2021 Prepared by: [place]  Exercises Seated Scapular Retraction - 2 x daily - 7 x weekly - 5-10 reps - 5 hold Standing Shoulder W at Wall - 1 x daily - 7 x weekly - 1 sets - 5 reps - 5 seconds hold Standing Shoulder Row with Anchored Resistance - 1 x daily - 7 x weekly - 2 sets - 10 reps Standing Shoulder External Rotation with Resistance - 1 x daily - 7 x weekly - 2 sets - 10 reps Bicep Stretch at Table - 2-3 x daily - 7 x weekly -  1 sets - 2-3 reps - 15 hold Supine Chest Stretch on Foam Roll - 1-2 x daily - 7 x weekly - 1 sets - 3 reps - 30 seconds hold

## 2021-08-22 ENCOUNTER — Ambulatory Visit: Payer: 59 | Admitting: Physical Therapy

## 2021-08-25 ENCOUNTER — Other Ambulatory Visit: Payer: Self-pay

## 2021-08-25 ENCOUNTER — Ambulatory Visit: Payer: 59 | Admitting: Physical Therapy

## 2021-08-25 ENCOUNTER — Telehealth: Payer: Self-pay

## 2021-08-25 DIAGNOSIS — M6281 Muscle weakness (generalized): Secondary | ICD-10-CM

## 2021-08-25 DIAGNOSIS — G8929 Other chronic pain: Secondary | ICD-10-CM

## 2021-08-25 DIAGNOSIS — M25512 Pain in left shoulder: Secondary | ICD-10-CM

## 2021-08-25 DIAGNOSIS — R29898 Other symptoms and signs involving the musculoskeletal system: Secondary | ICD-10-CM

## 2021-08-25 NOTE — Telephone Encounter (Signed)
Patient aware of results and is scheduled for follow up imaging next week.

## 2021-08-25 NOTE — Therapy (Signed)
[place], [place], [address] Phone: [phone]   Fax:  [phone]  Physical Therapy Treatment  Patient Details  Name: Colleen Miller MRN: [id number] Date of Birth: 09-19-1975 Referring Provider (PT): Thekkekandam   Encounter Date: 08/25/2021   PT End of Session - 08/25/21 1144     Visit Number 3    Number of Visits 12    Date for PT Re-Evaluation 09/27/21    PT Start Time 1056    PT Stop Time 1136    PT Time Calculation (min) 40 min    Activity Tolerance Patient tolerated treatment well    Behavior During Therapy [facility] for tasks assessed/performed             Past Medical History:  Diagnosis Date   Obesity (BMI 30-39.9)     Past Surgical History:  Procedure Laterality Date   ANKLE FRACTURE SURGERY      There were no vitals filed for this visit.   Subjective Assessment - 08/25/21 1056     Subjective Pt states that things are going "good"    Patient Stated Goals decrease pain    Currently in Pain? No/denies                               [facility] Adult PT Treatment/Exercise - 08/25/21 0001       Shoulder Exercises: Supine   Other Supine Exercises hooklying on green noodle pec stretch x 2 min, alt shoulder flexion, serratus punch 2# x 12      Shoulder Exercises: Prone   Other Prone Exercises Y,T,I x 10 no resistance      Shoulder Exercises: Standing   Extension 20 reps    Theraband Level (Shoulder Extension) Level 2 (Red)    Row 20 reps    Theraband Level (Shoulder Row) Level 2 (Red)    Diagonals 10 reps    Theraband Level (Shoulder Diagonals) Level 1 (Yellow)    Other Standing Exercises bilat ER red TB x 10      Shoulder Exercises: ROM/Strengthening   UBE (Upper Arm Bike) L4 x 4 min alt fwd/bkwd      Shoulder Exercises: Stretch   Other Shoulder Stretches midlevel doorway stretch 2 x 20 sec      Manual Therapy   Soft tissue mobilization STM Lt  deltoid, pecs to decrease fascial restrictions and improve mobility                          PT Long Term Goals - 08/16/21 1120       PT LONG TERM GOAL #1   Title Pt will be independent in HEP    Time 6    Period Weeks    Status New    Target Date 09/27/21      PT LONG TERM GOAL #2   Title Pt will improve FOTO to >= 71 to demo improved functional mobility    Time 6    Period Weeks    Status New    Target Date 09/27/21      PT LONG TERM GOAL #3   Title Pt will improve Lt shoulder strength to 4+/5 to improve tolerance to work activities    Time 6    Period Weeks    Status New    Target Date 09/27/21      PT LONG TERM GOAL #4  Title Pt will be able to place items in overhead cabinet with pain <= 1/10    Time 6    Period Weeks    Status New    Target Date 09/27/21                   Plan - 08/25/21 1144     Clinical Impression Statement Pt with improving tolerance to stretching and strengthening exercises. Continues with increased mm spasticity in Lt deltoids and pecs, responds well to manual therapy.    PT Next Visit Plan update HEP, progress postural strength    PT Home Exercise Plan [email or web address]    Consulted and Agree with Plan of Care Patient             Patient will benefit from skilled therapeutic intervention in order to improve the following deficits and impairments:     Visit Diagnosis: Muscle weakness (generalized)  Chronic left shoulder pain  Other symptoms and signs involving the musculoskeletal system     Problem List Patient Active Problem List   Diagnosis Date Noted   Hyperlipidemia 07/21/2021   Leukocytosis 07/21/2021   Impingement syndrome, shoulder, left 07/19/2021   DDD (degenerative disc disease), cervical 10/16/2018   DDD (degenerative disc disease), lumbar 10/16/2018   Menometrorrhagia 10/16/2018   Migraine headache without aura 11/15/2015   Annual physical exam 09/27/2014    Pratyush Ammon,  PT 08/25/2021, 11:46 AM  [facility] [place], [place], [address] Phone: [phone]   Fax:  [phone]  Name: Colleen Miller MRN: [id number] Date of Birth: [birth year]

## 2021-08-28 ENCOUNTER — Ambulatory Visit: Payer: 59 | Admitting: Sports Medicine

## 2021-08-29 ENCOUNTER — Encounter: Payer: 59 | Admitting: Physical Therapy

## 2021-08-30 LAB — HM MAMMOGRAPHY

## 2021-08-30 LAB — COLOGUARD: COLOGUARD: NEGATIVE

## 2021-09-01 ENCOUNTER — Ambulatory Visit: Payer: 59 | Admitting: Physical Therapy

## 2021-09-01 ENCOUNTER — Encounter: Payer: Self-pay | Admitting: Physical Therapy

## 2021-09-01 ENCOUNTER — Other Ambulatory Visit: Payer: Self-pay

## 2021-09-01 DIAGNOSIS — M6281 Muscle weakness (generalized): Secondary | ICD-10-CM | POA: Diagnosis not present

## 2021-09-01 DIAGNOSIS — G8929 Other chronic pain: Secondary | ICD-10-CM

## 2021-09-01 DIAGNOSIS — R29898 Other symptoms and signs involving the musculoskeletal system: Secondary | ICD-10-CM

## 2021-09-01 NOTE — Therapy (Signed)
[place], [place], [address] Phone: [phone]   Fax:  [phone]  Physical Therapy Treatment  Patient Details  Name: Colleen Miller MRN: [id number] Date of Birth: [birth year] Referring Provider (PT): Thekkekandam   Encounter Date: 09/01/2021   PT End of Session - 09/01/21 1106     Visit Number 4    Number of Visits 12    Date for PT Re-Evaluation 09/27/21    Authorization Type UHC    PT Start Time 1104    PT Stop Time 1140    PT Time Calculation (min) 36 min    Activity Tolerance Patient tolerated treatment well    Behavior During Therapy [facility] for tasks assessed/performed             Past Medical History:  Diagnosis Date   Obesity (BMI 30-39.9)     Past Surgical History:  Procedure Laterality Date   ANKLE FRACTURE SURGERY      There were no vitals filed for this visit.   Subjective Assessment - 09/01/21 1107     Subjective Pt reports no new changes with Lt shoulder.  She slept on couch 2 days ago and has kink in her Lt side of neck. Lt shoulder has been feeling better.  She still has not used it to lift anything, nor tried to sleep on Lt side.    Patient Stated Goals decrease pain    Currently in Pain? Yes    Pain Score 7     Pain Location Neck    Pain Orientation Left;Lateral    Pain Descriptors / Indicators Tightness;Sharp    Aggravating Factors  turning head Lt.    Pain Relieving Factors ?                [facility] PT Assessment - 09/01/21 0001       Assessment   Medical Diagnosis impingement of Lt shoulder    Referring Provider (PT) Thekkekandam    Onset Date/Surgical Date 08/17/19    Hand Dominance Right    Next MD Visit 08/28/21    Prior Therapy none      AROM   Left Shoulder Flexion 135 Degrees    Left Shoulder ABduction 122 Degrees    Left Shoulder External Rotation 70 Degrees   scaption 90 deg     Strength   Left Shoulder Flexion 4/5    Left Shoulder  External Rotation 3+/5               [facility] Adult PT Treatment/Exercise - 09/01/21 0001       Elbow Exercises   Elbow Flexion Strengthening;Left;10 reps   yellow band in sitting     Shoulder Exercises: Seated   Horizontal ABduction Both;10 reps    Theraband Level (Shoulder Horizontal ABduction) Level 1 (Yellow)    External Rotation --    Theraband Level (Shoulder External Rotation) --    Flexion Strengthening;Left;5 reps   to 90 deg.   Theraband Level (Shoulder Flexion) Level 1 (Yellow)    Abduction Strengthening;Left   3 reps to 90 deg     Shoulder Exercises: Standing   Flexion Left;10 reps;Strengthening    Shoulder Flexion Weight (lbs) 1    Flexion Limitations counter to shoulder height and overhead height shelves    Other Standing Exercises bilat ER red TB x 5; switched to yellow x 10, 2 sets      Shoulder Exercises: ROM/Strengthening   UBE (Upper Arm Bike) L4: 1.5  min each direction, standing      Shoulder Exercises: Stretch   Other Shoulder Stretches midlevel doorway stretch 2 x 20 sec, bilat bicep stretch holding doorframe x 20 sec x 2;  Lt bicep stretch holding counter x 15 sec x 2    Other Shoulder Stretches Lt upper trap and levator stretch x 15 sec x 2; shoulder rolls      Modalities   Modalities Moist Heat      Moist Heat Therapy   Number Minutes Moist Heat 15 Minutes   during exercises   Moist Heat Location Cervical      Manual Therapy   Soft tissue mobilization IASTM to Lt ant deltoid, biceps brachii, brachialis to decrease fascial restrictions and improve mobility.      Kinesiotix   Create Space I strip of reg Rock tape applied to ant arm - Lt biceps brachi with 2 perpendicular strips at areas of tightness; to decompress tissue and increase proprioception.              PT Long Term Goals - 08/16/21 1120       PT LONG TERM GOAL #1   Title Pt will be independent in HEP    Time 6    Period Weeks    Status New    Target Date 09/27/21      PT  LONG TERM GOAL #2   Title Pt will improve FOTO to >= 71 to demo improved functional mobility    Time 6    Period Weeks    Status New    Target Date 09/27/21      PT LONG TERM GOAL #3   Title Pt will improve Lt shoulder strength to 4+/5 to improve tolerance to work activities    Time 6    Period Weeks    Status New    Target Date 09/27/21      PT LONG TERM GOAL #4   Title Pt will be able to place items in overhead cabinet with pain <= 1/10    Time 6    Period Weeks    Status New    Target Date 09/27/21                   Plan - 09/01/21 1119     Clinical Impression Statement Pt demonstrating improvement in Lt shoulder ROM; gradual improvement in strength.  Pt reported some increase in discomfort in Lt shoulder with red resistance; decreased to yellow band with good tolerance.Lt arm fatigued with minimum resistance.  Progressing gradually towards goals.    PT Frequency 2x / week    PT Duration 6 weeks    PT Treatment/Interventions Cryotherapy;Electrical Stimulation;Iontophoresis 34m/ml Dexamethasone;Moist Heat;Therapeutic exercise;Patient/family education;Therapeutic activities;Neuromuscular re-education;Manual techniques;Taping;Dry needling;Vasopneumatic Device;Passive range of motion    PT Next Visit Plan assess response to  HEP, progress postural strength.    PT Home Exercise Plan [email or web address]   Consulted and Agree with Plan of Care Patient             Patient will benefit from skilled therapeutic intervention in order to improve the following deficits and impairments:  Pain, Impaired UE functional use, Decreased strength, Decreased activity tolerance, Decreased range of motion  Visit Diagnosis: Muscle weakness (generalized)  Chronic left shoulder pain  Other symptoms and signs involving the musculoskeletal system     Problem List Patient Active Problem List   Diagnosis Date Noted   Hyperlipidemia 07/21/2021   Leukocytosis 07/21/2021   Impingement  syndrome,  shoulder, left 07/19/2021   DDD (degenerative disc disease), cervical 10/16/2018   DDD (degenerative disc disease), lumbar 10/16/2018   Menometrorrhagia 10/16/2018   Migraine headache without aura 11/15/2015   Annual physical exam 09/27/2014   Kerin Perna, PTA 09/01/21 1:04 PM  [place], [place], [address] Phone: [phone]   Fax:  [phone]  Name: Colleen Miller MRN: [id number] Date of Birth: [birth year]

## 2021-09-05 ENCOUNTER — Ambulatory Visit: Payer: 59 | Admitting: Physical Therapy

## 2021-09-05 ENCOUNTER — Other Ambulatory Visit: Payer: Self-pay

## 2021-09-05 DIAGNOSIS — G8929 Other chronic pain: Secondary | ICD-10-CM

## 2021-09-05 DIAGNOSIS — M6281 Muscle weakness (generalized): Secondary | ICD-10-CM | POA: Diagnosis not present

## 2021-09-05 DIAGNOSIS — R29898 Other symptoms and signs involving the musculoskeletal system: Secondary | ICD-10-CM

## 2021-09-05 DIAGNOSIS — M25512 Pain in left shoulder: Secondary | ICD-10-CM

## 2021-09-05 NOTE — Therapy (Signed)
[place], [place], [address] Phone: [phone]   Fax:  [phone]  Physical Therapy Treatment  Patient Details  Name: Colleen Miller MRN: [id number] Date of Birth: May 22, 1976 Referring Provider (PT): Thekkekandam   Encounter Date: 09/05/2021   PT End of Session - 09/05/21 1147     Visit Number 5    Number of Visits 12    Date for PT Re-Evaluation 09/27/21    PT Start Time 1100    PT Stop Time 1140    PT Time Calculation (min) 40 min    Activity Tolerance Patient tolerated treatment well    Behavior During Therapy [facility] for tasks assessed/performed             Past Medical History:  Diagnosis Date   Obesity (BMI 30-39.9)     Past Surgical History:  Procedure Laterality Date   ANKLE FRACTURE SURGERY      There were no vitals filed for this visit.   Subjective Assessment - 09/05/21 1102     Subjective Pt states no new changes since last visit    Patient Stated Goals decrease pain    Currently in Pain? No/denies                [facility] PT Assessment - 09/05/21 0001       Assessment   Medical Diagnosis impingement of Lt shoulder    Referring Provider (PT) Thekkekandam    Onset Date/Surgical Date 08/17/19    Hand Dominance Right    Next MD Visit 08/28/21    Prior Therapy none      Strength   Left Shoulder Flexion 4/5                           [facility] Adult PT Treatment/Exercise - 09/05/21 0001       Shoulder Exercises: Supine   Protraction Left;20 reps    Protraction Weight (lbs) 2    Other Supine Exercises rhythmic stablization Lt 3 x 30sec      Shoulder Exercises: Standing   Flexion Left;10 reps    Shoulder Flexion Weight (lbs) 1    Flexion Limitations to shoulder height    ABduction Left;10 reps    Shoulder ABduction Weight (lbs) 1    ABduction Limitations then scaption x 10 1#    Extension 20 reps    Theraband Level (Shoulder Extension) Level 3  (Green)    Row 20 reps    Theraband Level (Shoulder Row) Level 3 (Green)    Diagonals 20 reps    Theraband Level (Shoulder Diagonals) Level 1 (Yellow)    Other Standing Exercises bilat ER red TB 2 x 10      Shoulder Exercises: ROM/Strengthening   UBE (Upper Arm Bike) L4 x 4 min alt fwd/bkwd      Shoulder Exercises: Stretch   Other Shoulder Stretches midlevel doorway stretch 2 x 20 sec, bilat bicep stretch holding doorframe x 20 sec x 2      Manual Therapy   Soft tissue mobilization IASTM to Lt ant deltoid, biceps brachii, brachialis to decrease fascial restrictions and improve mobility.                     PT Education - 09/05/21 1143     Education Details updated HEP    Person(s) Educated Patient    Methods Explanation;Demonstration;Handout    Comprehension Returned demonstration;Verbalized understanding  PT Long Term Goals - 08/16/21 1120       PT LONG TERM GOAL #1   Title Pt will be independent in HEP    Time 6    Period Weeks    Status New    Target Date 09/27/21      PT LONG TERM GOAL #2   Title Pt will improve FOTO to >= 71 to demo improved functional mobility    Time 6    Period Weeks    Status New    Target Date 09/27/21      PT LONG TERM GOAL #3   Title Pt will improve Lt shoulder strength to 4+/5 to improve tolerance to work activities    Time 6    Period Weeks    Status New    Target Date 09/27/21      PT LONG TERM GOAL #4   Title Pt will be able to place items in overhead cabinet with pain <= 1/10    Time 6    Period Weeks    Status New    Target Date 09/27/21                   Plan - 09/05/21 1150     Clinical Impression Statement Pt continues with decreased LT shoudler strenght but is improving tolerance to exercises. Improved posture with decreased cues today.    PT Next Visit Plan progress Lt shoulder and postural strength    PT Home Exercise Plan [id number]             Patient will benefit  from skilled therapeutic intervention in order to improve the following deficits and impairments:     Visit Diagnosis: Muscle weakness (generalized)  Chronic left shoulder pain  Other symptoms and signs involving the musculoskeletal system     Problem List Patient Active Problem List   Diagnosis Date Noted   Hyperlipidemia 07/21/2021   Leukocytosis 07/21/2021   Impingement syndrome, shoulder, left 07/19/2021   DDD (degenerative disc disease), cervical 10/16/2018   DDD (degenerative disc disease), lumbar 10/16/2018   Menometrorrhagia 10/16/2018   Migraine headache without aura 11/15/2015   Annual physical exam 09/27/2014    Robbi Scurlock, PT 09/05/2021, 11:53 AM  [facility] [place], [place], [address] Phone: [phone]   Fax:  [phone]  Name: Talesha Ellithorpe MRN: [id number] Date of Birth: [birth year]

## 2021-09-05 NOTE — Patient Instructions (Signed)
Access Code: [email or web address] URL: [email or web address] Date: 09/05/2021 Prepared by: Isabelle Course  Exercises Standing Shoulder Row with Anchored Resistance - 1 x daily - 7 x weekly - 2 sets - 10 reps Standing Shoulder External Rotation with Resistance - 1 x daily - 7 x weekly - 2 sets - 10 reps Bicep Stretch at Table - 2-3 x daily - 7 x weekly - 1 sets - 2-3 reps - 15 hold Seated Shoulder Diagonal with Resistance - 1 x daily - 7 x weekly - 2 sets - 10 reps Scaption with Dumbbells - 1 x daily - 7 x weekly - 1 sets - 10 reps Standing Shoulder Flexion to 90 Degrees with Dumbbells - 1 x daily - 7 x weekly - 1 sets - 10 reps Shoulder Abduction with Dumbbells - Thumbs Up - 1 x daily - 7 x weekly - 1 sets - 10 reps

## 2021-09-08 ENCOUNTER — Other Ambulatory Visit: Payer: Self-pay

## 2021-09-08 ENCOUNTER — Ambulatory Visit: Payer: 59 | Admitting: Physical Therapy

## 2021-09-08 DIAGNOSIS — M6281 Muscle weakness (generalized): Secondary | ICD-10-CM | POA: Diagnosis not present

## 2021-09-08 DIAGNOSIS — M25512 Pain in left shoulder: Secondary | ICD-10-CM

## 2021-09-08 DIAGNOSIS — G8929 Other chronic pain: Secondary | ICD-10-CM

## 2021-09-08 DIAGNOSIS — R29898 Other symptoms and signs involving the musculoskeletal system: Secondary | ICD-10-CM

## 2021-09-08 NOTE — Therapy (Signed)
[place], [place], [address] Phone: [phone]   Fax:  [phone]  Physical Therapy Treatment  Patient Details  Name: Colleen Miller MRN: [id number] Date of Birth: 1975/12/03 Referring Provider (PT): Thekkekandam   Encounter Date: 09/08/2021   PT End of Session - 09/08/21 1139     Visit Number 6    Number of Visits 12    Date for PT Re-Evaluation 09/27/21    PT Start Time 1057    PT Stop Time 1138    PT Time Calculation (min) 41 min    Activity Tolerance Patient tolerated treatment well    Behavior During Therapy [facility] for tasks assessed/performed             Past Medical History:  Diagnosis Date   Obesity (BMI 30-39.9)     Past Surgical History:  Procedure Laterality Date   ANKLE FRACTURE SURGERY      There were no vitals filed for this visit.   Subjective Assessment - 09/08/21 1054     Subjective no new changes since last visit    Patient Stated Goals decrease pain    Currently in Pain? No/denies                [facility] PT Assessment - 09/08/21 0001       Assessment   Medical Diagnosis impingement of Lt shoulder    Referring Provider (PT) Thekkekandam    Onset Date/Surgical Date 08/17/19    Hand Dominance Right    Prior Therapy none      AROM   Left Shoulder Flexion 140 Degrees    Left Shoulder ABduction 125 Degrees    Left Shoulder External Rotation 85 Degrees                           [facility] Adult PT Treatment/Exercise - 09/08/21 0001       Shoulder Exercises: Supine   Protraction Left;20 reps    Protraction Weight (lbs) 2    Other Supine Exercises rhythmic stablization Lt 3 x 30sec      Shoulder Exercises: Sidelying   External Rotation 20 reps    External Rotation Limitations x 10 with 2#, x10 no weight      Shoulder Exercises: Standing   Flexion Left;10 reps    Shoulder Flexion Weight (lbs) 1    Flexion Limitations to shoulder height     ABduction Left;10 reps    Shoulder ABduction Weight (lbs) 1    ABduction Limitations then scaption x 10 1#    Extension --   30 reps   Theraband Level (Shoulder Extension) Level 3 (Green)    Row --   30 reps   Theraband Level (Shoulder Row) Level 3 (Green)    Diagonals 20 reps    Theraband Level (Shoulder Diagonals) Level 1 (Yellow)    Other Standing Exercises bilat ER red TB 2 x 10      Shoulder Exercises: ROM/Strengthening   UBE (Upper Arm Bike) L4 x 4 min alt fwd/bkwd      Shoulder Exercises: Stretch   Other Shoulder Stretches midlevel doorway stretch 2 x 20 sec, bilat bicep stretch holding doorframe x 20 sec x 2    Other Shoulder Stretches single arm doorway stretch for ER and extension      Manual Therapy   Manual Therapy Joint mobilization    Joint Mobilization grade 2-3 inferior GH mobs to improve abduction ROM  Soft tissue mobilization STM anterior and posterior shoulder mm                          PT Long Term Goals - 08/16/21 1120       PT LONG TERM GOAL #1   Title Pt will be independent in HEP    Time 6    Period Weeks    Status New    Target Date 09/27/21      PT LONG TERM GOAL #2   Title Pt will improve FOTO to >= 71 to demo improved functional mobility    Time 6    Period Weeks    Status New    Target Date 09/27/21      PT LONG TERM GOAL #3   Title Pt will improve Lt shoulder strength to 4+/5 to improve tolerance to work activities    Time 6    Period Weeks    Status New    Target Date 09/27/21      PT LONG TERM GOAL #4   Title Pt will be able to place items in overhead cabinet with pain <= 1/10    Time 6    Period Weeks    Status New    Target Date 09/27/21                   Plan - 09/08/21 1140     Clinical Impression Statement Pt with improving Lt shoulder ROM, still with decreased strength. Showed pt doorway ER stretch to improve mobility and decrease pain. Pt progressing well wiht exercises    PT Next Visit  Plan progress Lt shoulder and postural strength, UPDATE HEP    PT Home Exercise Plan [id number]    Consulted and Agree with Plan of Care Patient             Patient will benefit from skilled therapeutic intervention in order to improve the following deficits and impairments:     Visit Diagnosis: Muscle weakness (generalized)  Chronic left shoulder pain  Other symptoms and signs involving the musculoskeletal system     Problem List Patient Active Problem List   Diagnosis Date Noted   Hyperlipidemia 07/21/2021   Leukocytosis 07/21/2021   Impingement syndrome, shoulder, left 07/19/2021   DDD (degenerative disc disease), cervical 10/16/2018   DDD (degenerative disc disease), lumbar 10/16/2018   Menometrorrhagia 10/16/2018   Migraine headache without aura 11/15/2015   Annual physical exam 09/27/2014    Isabelle Course, PT 09/08/2021, 11:42 AM  [facility] [place], [place], [address] Phone: [phone]   Fax:  [phone]  Name: Colleen Miller MRN: [id number] Date of Birth: [birth year]

## 2021-09-12 ENCOUNTER — Ambulatory Visit: Payer: 59 | Admitting: Physical Therapy

## 2021-09-12 DIAGNOSIS — R29898 Other symptoms and signs involving the musculoskeletal system: Secondary | ICD-10-CM

## 2021-09-12 DIAGNOSIS — G8929 Other chronic pain: Secondary | ICD-10-CM

## 2021-09-12 DIAGNOSIS — M6281 Muscle weakness (generalized): Secondary | ICD-10-CM | POA: Diagnosis not present

## 2021-09-12 NOTE — Therapy (Signed)
[place], [place], [address] Phone: [phone]   Fax:  [phone]  Physical Therapy Treatment  Patient Details  Name: Colleen Miller MRN: [id number] Date of Birth: April 06, 1976 Referring Provider (PT): Thekkekandam   Encounter Date: 09/12/2021   PT End of Session - 09/12/21 1139     Visit Number 7    Number of Visits 12    Date for PT Re-Evaluation 09/27/21    Authorization Type UHC    PT Start Time 1057    PT Stop Time 1138    PT Time Calculation (min) 41 min    Activity Tolerance Patient tolerated treatment well    Behavior During Therapy [facility] for tasks assessed/performed             Past Medical History:  Diagnosis Date   Obesity (BMI 30-39.9)     Past Surgical History:  Procedure Laterality Date   ANKLE FRACTURE SURGERY      There were no vitals filed for this visit.   Subjective Assessment - 09/12/21 1057     Subjective pt states "my shoulder is doing good"    Patient Stated Goals decrease pain    Currently in Pain? No/denies                [facility] PT Assessment - 09/12/21 0001       Assessment   Medical Diagnosis impingement of Lt shoulder    Referring Provider (PT) Thekkekandam    Onset Date/Surgical Date 08/17/19    Hand Dominance Right    Prior Therapy none                           [facility] Adult PT Treatment/Exercise - 09/12/21 0001       Shoulder Exercises: Supine   Protraction Left;20 reps    Protraction Weight (lbs) 2      Shoulder Exercises: Standing   Flexion Right;Left;10 reps    Shoulder Flexion Weight (lbs) 1    Flexion Limitations to shoulder height with isometric hold opposite shoulder    ABduction Right;Left;10 reps    Shoulder ABduction Weight (lbs) 1    ABduction Limitations with isometric hold on opp UE, then scaption x 10 bilat 1# with isometric hold opp UE    Row --   30 reps   Theraband Level (Shoulder Row) Level 3  (Green)    Other Standing Exercises bilat ER red TB 3 x10      Shoulder Exercises: ROM/Strengthening   UBE (Upper Arm Bike) L4 x 4 min alt fwd/bkwd    Ball on Wall 2 x 1 min CW/CCW      Shoulder Exercises: Stretch   Other Shoulder Stretches midlevel doorway stretch 2 x 20 sec, bilat bicep stretch holding doorframe x 20 sec x 2      Manual Therapy   Joint Mobilization grade 2-3 inferior GH mobs to improve abduction ROM                          PT Long Term Goals - 08/16/21 1120       PT LONG TERM GOAL #1   Title Pt will be independent in HEP    Time 6    Period Weeks    Status New    Target Date 09/27/21      PT LONG TERM GOAL #2   Title Pt will improve FOTO  to >= 71 to demo improved functional mobility    Time 6    Period Weeks    Status New    Target Date 09/27/21      PT LONG TERM GOAL #3   Title Pt will improve Lt shoulder strength to 4+/5 to improve tolerance to work activities    Time 6    Period Weeks    Status New    Target Date 09/27/21      PT LONG TERM GOAL #4   Title Pt will be able to place items in overhead cabinet with pain <= 1/10    Time 6    Period Weeks    Status New    Target Date 09/27/21                   Plan - 09/12/21 1139     Clinical Impression Statement Pt continues to progress well. Incorporated shoulder endurance with static holds, pt with no c/o increased pain. Pt still with increased tightness at end range abduction, improves with manual work             Patient will benefit from skilled therapeutic intervention in order to improve the following deficits and impairments:     Visit Diagnosis: Muscle weakness (generalized)  Chronic left shoulder pain  Other symptoms and signs involving the musculoskeletal system     Problem List Patient Active Problem List   Diagnosis Date Noted   Hyperlipidemia 07/21/2021   Leukocytosis 07/21/2021   Impingement syndrome, shoulder, left 07/19/2021   DDD  (degenerative disc disease), cervical 10/16/2018   DDD (degenerative disc disease), lumbar 10/16/2018   Menometrorrhagia 10/16/2018   Migraine headache without aura 11/15/2015   Annual physical exam 09/27/2014    Audi Conover, PT 09/12/2021, 11:40 AM  [facility] [place], [place], [address] Phone: [phone]   Fax:  [phone]  Name: Colleen Miller MRN: [id number] Date of Birth: 08-20-1975

## 2021-09-15 ENCOUNTER — Other Ambulatory Visit: Payer: Self-pay

## 2021-09-15 ENCOUNTER — Ambulatory Visit: Payer: 59 | Attending: Sports Medicine | Admitting: Physical Therapy

## 2021-09-15 DIAGNOSIS — R29898 Other symptoms and signs involving the musculoskeletal system: Secondary | ICD-10-CM | POA: Diagnosis present

## 2021-09-15 DIAGNOSIS — M25512 Pain in left shoulder: Secondary | ICD-10-CM | POA: Insufficient documentation

## 2021-09-15 DIAGNOSIS — G8929 Other chronic pain: Secondary | ICD-10-CM | POA: Insufficient documentation

## 2021-09-15 DIAGNOSIS — M6281 Muscle weakness (generalized): Secondary | ICD-10-CM | POA: Diagnosis not present

## 2021-09-15 NOTE — Therapy (Signed)
[facility], [place], [address] Phone: [phone]   Fax:  [phone]  Physical Therapy Treatment  Patient Details  Name: Colleen Miller MRN: [id number] Date of Birth: 07-May-1976 Referring Provider (PT): Thekkekandam   Encounter Date: 09/15/2021   PT End of Session - 09/15/21 1141     Visit Number 8    Number of Visits 12    Date for PT Re-Evaluation 09/27/21    PT Start Time 1055    PT Stop Time 1140    PT Time Calculation (min) 45 min    Activity Tolerance Patient tolerated treatment well    Behavior During Therapy [facility] for tasks assessed/performed             Past Medical History:  Diagnosis Date   Obesity (BMI 30-39.9)     Past Surgical History:  Procedure Laterality Date   ANKLE FRACTURE SURGERY      There were no vitals filed for this visit.   Subjective Assessment - 09/15/21 1056     Subjective Pt states no new changes since last visit    Patient Stated Goals decrease pain    Currently in Pain? No/denies                               [facility] Adult PT Treatment/Exercise - 09/15/21 0001       Shoulder Exercises: Standing   Flexion Right;Left;20 reps    Shoulder Flexion Weight (lbs) 1    Flexion Limitations to shoulder height with isometric hold opposite shoulder    ABduction Right;Left;20 reps    Shoulder ABduction Weight (lbs) 1    ABduction Limitations with isometric hold on opp UE, then scaption x 10 bilat 1# with isometric hold opp UE    Extension --   30 reps   Theraband Level (Shoulder Extension) Level 3 (Green)    Extension Limitations with isometric hold x 3    Row --   30 reps   Theraband Level (Shoulder Row) Level 3 (Green)    Row Limitations with isometric hold x 3    Diagonals 10 reps    Theraband Level (Shoulder Diagonals) Level 2 (Red)    Other Standing Exercises bilat ER green TB 2 x 10      Shoulder Exercises: ROM/Strengthening   UBE  (Upper Arm Bike) L4 x 4 min alt fwd/bkwd    Wall Pushups Limitations serratus push up 2 x 10    Ball on Wall 2 x 1 min CW/CCW      Shoulder Exercises: Stretch   Other Shoulder Stretches midlevel doorway stretch 2 x 20 sec, bilat bicep stretch holding doorframe x 20 sec x 2      Manual Therapy   Joint Mobilization grade 2-3 inferior GH mobs to improve abduction ROM    Soft tissue mobilization STM Lt bicep, anterior and medial detoids                          PT Long Term Goals - 08/16/21 1120       PT LONG TERM GOAL #1   Title Pt will be independent in HEP    Time 6    Period Weeks    Status New    Target Date 09/27/21      PT LONG TERM GOAL #2   Title Pt will improve FOTO to >= 71  to demo improved functional mobility    Time 6    Period Weeks    Status New    Target Date 09/27/21      PT LONG TERM GOAL #3   Title Pt will improve Lt shoulder strength to 4+/5 to improve tolerance to work activities    Time 6    Period Weeks    Status New    Target Date 09/27/21      PT LONG TERM GOAL #4   Title Pt will be able to place items in overhead cabinet with pain <= 1/10    Time 6    Period Weeks    Status New    Target Date 09/27/21                   Plan - 09/15/21 1142     Clinical Impression Statement Pt continues to progress strength and endurance. Increased resistance with shoulder ER and shoulder diagonals today with good response    PT Next Visit Plan progress Lt shoulder and postural strength, UPDATE HEP    PT Home Exercise Plan [id number]    Consulted and Agree with Plan of Care Patient             Patient will benefit from skilled therapeutic intervention in order to improve the following deficits and impairments:     Visit Diagnosis: Muscle weakness (generalized)  Chronic left shoulder pain  Other symptoms and signs involving the musculoskeletal system     Problem List Patient Active Problem List   Diagnosis Date Noted    Hyperlipidemia 07/21/2021   Leukocytosis 07/21/2021   Impingement syndrome, shoulder, left 07/19/2021   DDD (degenerative disc disease), cervical 10/16/2018   DDD (degenerative disc disease), lumbar 10/16/2018   Menometrorrhagia 10/16/2018   Migraine headache without aura 11/15/2015   Annual physical exam 09/27/2014    Myna Freimark, PT 09/15/2021, 11:57 AM  [facility] [place], [place], [address] Phone: [phone]   Fax:  [phone]  Name: Colleen Miller MRN: [id number] Date of Birth: December 25, 1975

## 2021-09-19 ENCOUNTER — Ambulatory Visit: Payer: 59 | Admitting: Physical Therapy

## 2021-09-22 ENCOUNTER — Ambulatory Visit: Payer: 59 | Admitting: Physical Therapy

## 2021-09-26 ENCOUNTER — Ambulatory Visit: Payer: 59 | Admitting: Physical Therapy

## 2021-10-04 ENCOUNTER — Encounter: Payer: Self-pay | Admitting: Sports Medicine

## 2021-10-18 LAB — LIPID PANEL
Cholesterol: 158 mg/dL (ref <200)
HDL: 43 mg/dL — ABNORMAL LOW (ref >=50)
LDL Cholesterol (Calc): 90 mg/dL (calc)
Non-HDL Cholesterol (Calc): 115 mg/dL (calc) (ref <130)
Total CHOL/HDL Ratio: 3.7 (calc) (ref <5.0)
Triglycerides: 145 mg/dL (ref <150)

## 2021-10-18 LAB — COMPREHENSIVE METABOLIC PANEL
AG Ratio: 1.5 (calc) (ref 1.0–2.5)
ALT: 10 U/L (ref 6–29)
AST: 12 U/L (ref 10–35)
Albumin: 4 g/dL (ref 3.6–5.1)
Alkaline phosphatase (APISO): 42 U/L (ref 31–125)
BUN/Creatinine Ratio: 11 (calc) (ref 6–22)
BUN: 11 mg/dL (ref 7–25)
CO2: 26 mmol/L (ref 20–32)
Calcium: 9.4 mg/dL (ref 8.6–10.2)
Chloride: 105 mmol/L (ref 98–110)
Creat: 1.04 mg/dL — ABNORMAL HIGH (ref 0.50–0.99)
Globulin: 2.7 g/dL (calc) (ref 1.9–3.7)
Glucose, Bld: 90 mg/dL (ref 65–99)
Potassium: 4.3 mmol/L (ref 3.5–5.3)
Sodium: 138 mmol/L (ref 135–146)
Total Bilirubin: 0.4 mg/dL (ref 0.2–1.2)
Total Protein: 6.7 g/dL (ref 6.1–8.1)

## 2021-10-18 LAB — CBC WITH DIFFERENTIAL/PLATELET
Absolute Monocytes: 694 cells/uL (ref 200–950)
Basophils Absolute: 71 cells/uL (ref 0–200)
Basophils Relative: 0.7 %
Eosinophils Absolute: 296 cells/uL (ref 15–500)
Eosinophils Relative: 2.9 %
HCT: 42.4 % (ref 35.0–45.0)
Hemoglobin: 13.9 g/dL (ref 11.7–15.5)
Lymphs Abs: 2550 cells/uL (ref 850–3900)
MCH: 32.9 pg (ref 27.0–33.0)
MCHC: 32.8 g/dL (ref 32.0–36.0)
MCV: 100.2 fL — ABNORMAL HIGH (ref 80.0–100.0)
MPV: 10.6 fL (ref 7.5–12.5)
Monocytes Relative: 6.8 %
Neutro Abs: 6589 cells/uL (ref 1500–7800)
Neutrophils Relative %: 64.6 %
Platelets: 313 10*3/uL (ref 140–400)
RBC: 4.23 10*6/uL (ref 3.80–5.10)
RDW: 11.8 % (ref 11.0–15.0)
Total Lymphocyte: 25 %
WBC: 10.2 10*3/uL (ref 3.8–10.8)

## 2021-10-18 LAB — PATHOLOGIST SMEAR REVIEW

## 2022-07-17 ENCOUNTER — Encounter: Payer: Self-pay | Admitting: Physician Assistant

## 2022-07-17 ENCOUNTER — Ambulatory Visit: Payer: 59 | Admitting: Physician Assistant

## 2022-07-17 VITALS — BP 127/75 | HR 97 | Ht 65.0 in | Wt 190.1 lb

## 2022-07-17 DIAGNOSIS — M5136 Other intervertebral disc degeneration, lumbar region: Secondary | ICD-10-CM

## 2022-07-17 DIAGNOSIS — M545 Low back pain, unspecified: Secondary | ICD-10-CM | POA: Diagnosis not present

## 2022-07-17 MED ORDER — CYCLOBENZAPRINE HCL 10 MG PO TABS: 10.0000 mg | ORAL_TABLET | Freq: Three times a day (TID) | ORAL | 0 refills | Status: DC | PRN

## 2022-07-17 MED ORDER — PREDNISONE 50 MG PO TABS: ORAL_TABLET | ORAL | 0 refills | Status: DC

## 2022-07-17 MED ORDER — TRAMADOL HCL 50 MG PO TABS: 50.0000 mg | ORAL_TABLET | Freq: Three times a day (TID) | ORAL | 0 refills | Status: DC | PRN

## 2022-07-17 MED ORDER — KETOROLAC TROMETHAMINE 60 MG/2ML IM SOLN
60.0000 mg | Freq: Once | INTRAMUSCULAR | Status: AC
  Administered 2022-07-17: 60 mg via INTRAMUSCULAR

## 2022-07-17 NOTE — Progress Notes (Signed)
Acute Office Visit  Subjective:     Patient ID: Colleen Miller, female    DOB: [birth year], 47 y.o.   MRN: [id number]  Chief Complaint  Patient presents with   Back Pain    HPI Patient is in today for severe low back pain that started 3 days ago. She has known lumbar DDD. She works in and flexed position on concrete floors for 12 hours a day. She did not have any injury but noticed when she went to lift her arm her whole back locked up. It is painful to sit, stand, move, lift or twist. BC powders and tylenol are not helping. She has had xrays of back before but no MRI. Denies any radicular symptoms, no bowel or bladder dysfunction, saddle numbness.   .. Active Ambulatory Problems    Diagnosis Date Noted   Annual physical exam 09/27/2014   Migraine headache without aura 11/15/2015   DDD (degenerative disc disease), cervical 10/16/2018   DDD (degenerative disc disease), lumbar 10/16/2018   Menometrorrhagia 10/16/2018   Impingement syndrome, shoulder, left 07/19/2021   Hyperlipidemia 07/21/2021   Leukocytosis 07/21/2021   Acute midline low back pain without sciatica 07/17/2022   Resolved Ambulatory Problems    Diagnosis Date Noted   Abdominal discomfort 09/27/2014   Skin rash 09/27/2014   Metrorrhagia 11/15/2015   Sebaceous cyst 10/16/2018   Past Medical History:  Diagnosis Date   Obesity (BMI 30-39.9)       ROS See HPI.      Objective:    BP 127/75 (BP Location: Left Arm, Patient Position: Sitting, Cuff Size: Normal)   Pulse 97   Ht 5\' 5"  (1.651 m)   Wt 190 lb 1.9 oz (86.2 kg)   LMP  (LMP Unknown)   SpO2 100%   BMI 31.64 kg/m  BP Readings from Last 3 Encounters:  07/17/22 127/75  08/16/21 111/73  07/19/21 135/84   Wt Readings from Last 3 Encounters:  07/17/22 190 lb 1.9 oz (86.2 kg)  08/16/21 187 lb 6.4 oz (85 kg)  07/19/21 195 lb 6.4 oz (88.6 kg)      Physical Exam Constitutional:      Appearance: Normal appearance.  Musculoskeletal:     Right  lower leg: No edema.     Left lower leg: No edema.     Comments: Tenderness to palpation over midline lumbar spine with tight paraspinal muscles Normal strength of lower extremity 5/5.  Normal reflexes bilaterally lower extremity.  Negative SLR, bilaterally.  Decreased ROM at waist due to pain.  Neurological:     General: No focal deficit present.     Mental Status: She is alert.  Psychiatric:        Mood and Affect: Mood normal.           Assessment & Plan:  Marland KitchenMarland KitchenKarianna was seen today for back pain.  Diagnoses and all orders for this visit:  Acute midline low back pain without sciatica -     predniSONE (DELTASONE) 50 MG tablet; One tab PO daily for 5 days. -     cyclobenzaprine (FLEXERIL) 10 MG tablet; Take 1 tablet (10 mg total) by mouth 3 (three) times daily as needed for muscle spasms. -     traMADol (ULTRAM) 50 MG tablet; Take 1 tablet (50 mg total) by mouth every 8 (eight) hours as needed for moderate pain. -     ketorolac (TORADOL) injection 60 mg  DDD (degenerative disc disease), lumbar -     predniSONE (DELTASONE)  50 MG tablet; One tab PO daily for 5 days. -     cyclobenzaprine (FLEXERIL) 10 MG tablet; Take 1 tablet (10 mg total) by mouth 3 (three) times daily as needed for muscle spasms. -     traMADol (ULTRAM) 50 MG tablet; Take 1 tablet (50 mg total) by mouth every 8 (eight) hours as needed for moderate pain. -     ketorolac (TORADOL) injection 60 mg   No red flags today Toradol 60mg  IM given today Likely DDD flare  Prednisone for 5 days Flexeril as needed, sedation warning given Tramadol for break through pain Encouraged icy hot patches, tens until, exercises Follow up with Dr. Darene Lamer in 2-3 weeks Written out of work for today  [employer], PA-C

## 2022-07-17 NOTE — Patient Instructions (Signed)
Tens unit Icy hot large patches over the spine

## 2022-07-31 ENCOUNTER — Ambulatory Visit: Payer: 59 | Admitting: Sports Medicine

## 2022-11-09 LAB — HM MAMMOGRAPHY

## 2022-12-07 ENCOUNTER — Encounter: Payer: Self-pay | Admitting: Sports Medicine

## 2022-12-08 IMAGING — DX DG SHOULDER 2+V*L*
3 series · 3 of 3 positions shown · non-contrast
Comparison: None.

CLINICAL DATA: Shoulder injury.  Chronic pain.

EXAM:
LEFT SHOULDER - 2+ VIEW

[shoulder grashey]
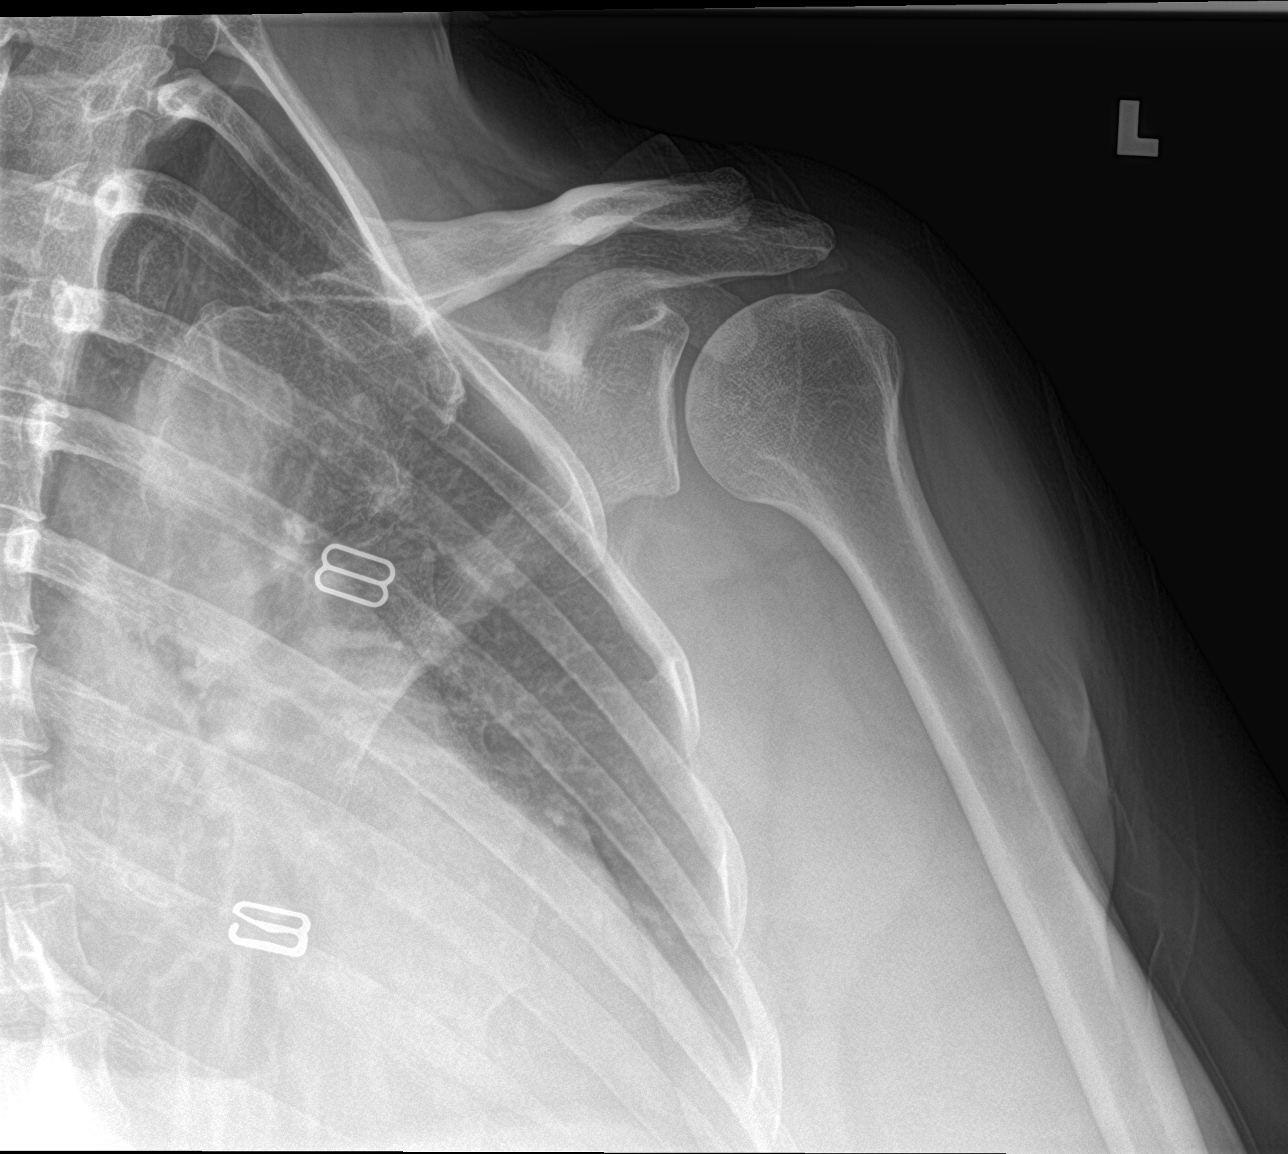

[shoulder y view]
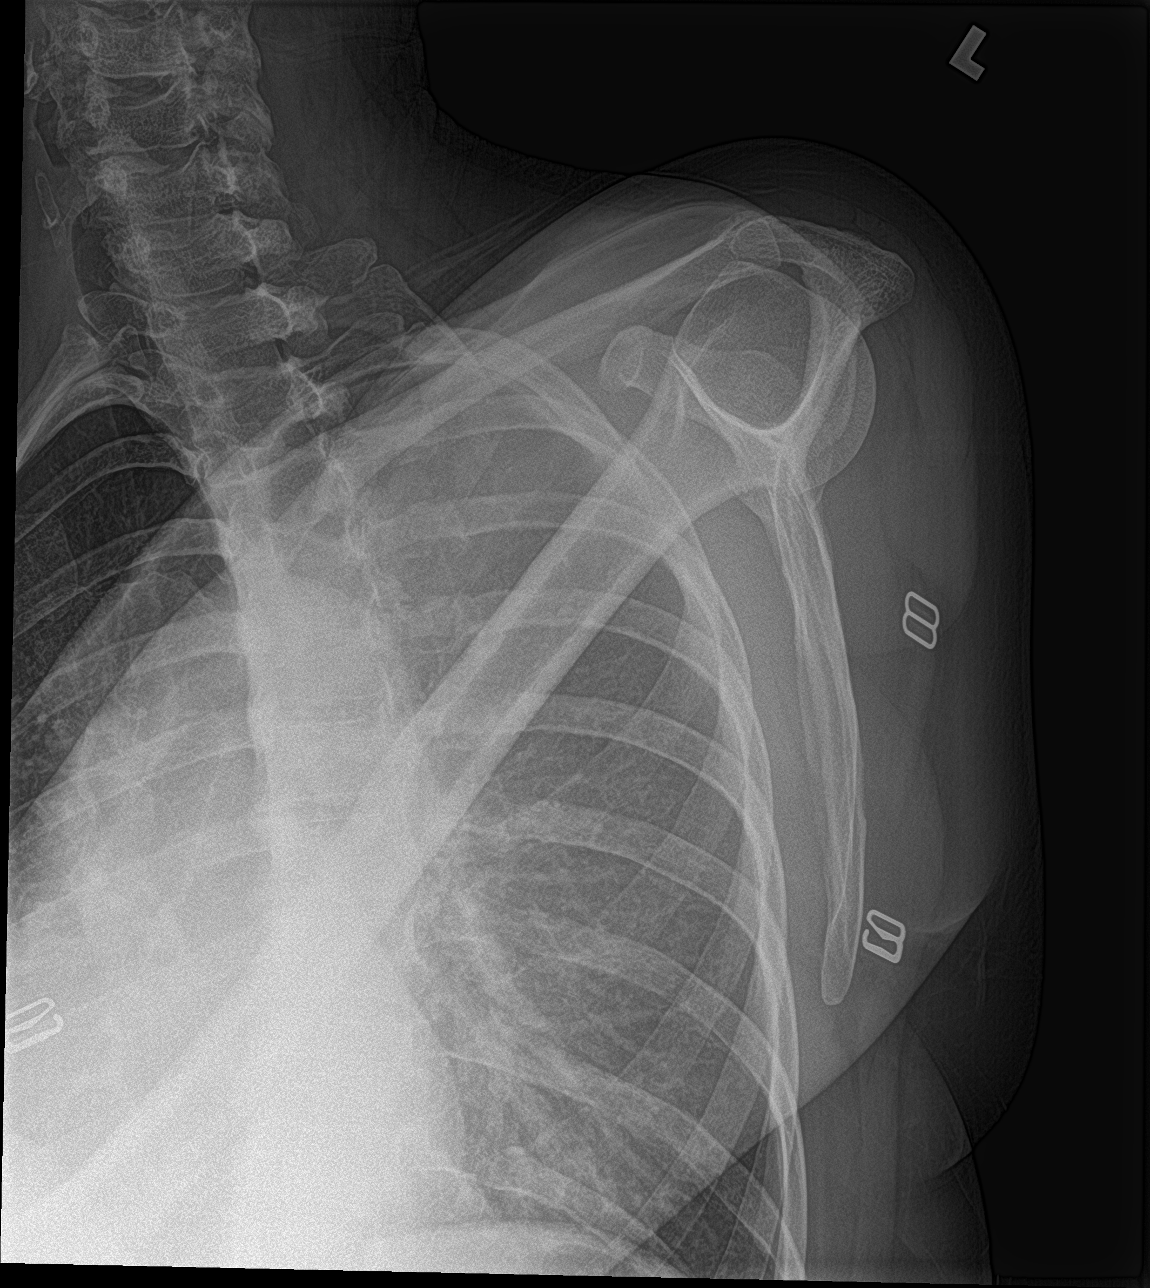

[shoulder axillary]
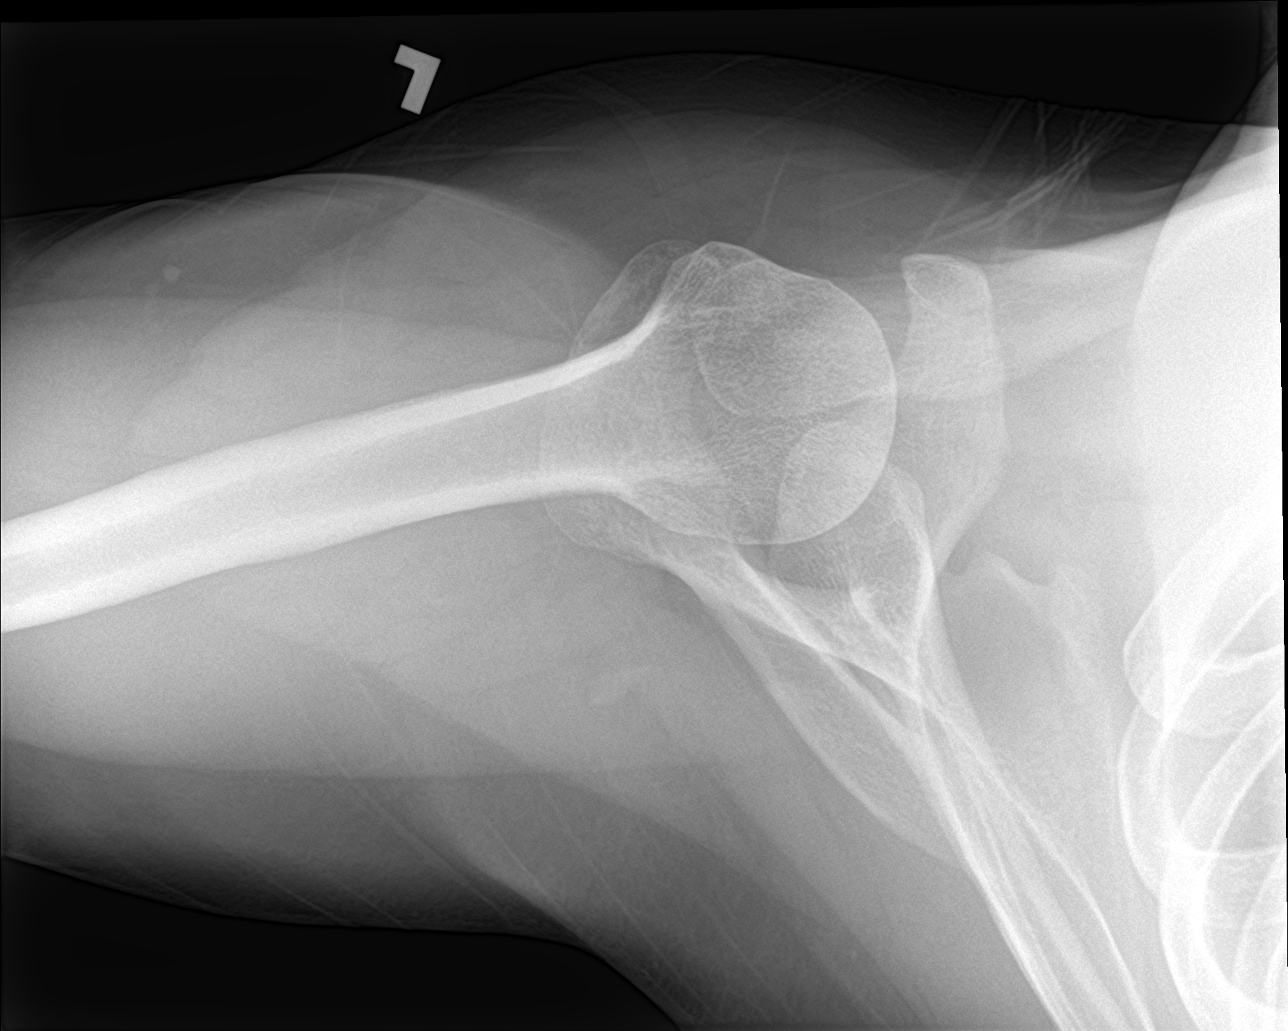

[3 of 3 positions shown; findings below may reference images not displayed]

FINDINGS: There is no evidence of fracture or dislocation. There is no
evidence of arthropathy or other focal bone abnormality. Soft
tissues are unremarkable.
IMPRESSION: Negative.

## 2023-01-14 ENCOUNTER — Telehealth: Payer: Self-pay

## 2023-01-14 MED ORDER — MELOXICAM 15 MG PO TABS: ORAL_TABLET | ORAL | 3 refills | Status: DC

## 2023-01-14 NOTE — Telephone Encounter (Signed)
I'll refill it, needs to get in for a physical.  Hasn't been seen by me in over a year.

## 2023-01-14 NOTE — Telephone Encounter (Signed)
Per on call nurse line - patient called stating she is having an issue getting her Meloxicam rx filled at the pharmacy. The rx was written by an UC provider in [place]. Patient continues to have back pain and is requesting a refill on the Meloxicam rx from the pcp.   Rx not listed in active med list. I called the patient to verify their request. During the call the patient was notified they will need an appt to re-evaluate their back pain. Patient voiced an understanding, however insisted we ask the provider for the Meloxicam refill. Please advise, thanks.

## 2023-01-15 NOTE — Telephone Encounter (Signed)
Per provider's request, patient is due for a physical. Has not been seen in over a year. Please contact the patient to schedule an appointment with provider. Thanks in advance.

## 2023-02-06 ENCOUNTER — Ambulatory Visit (INDEPENDENT_AMBULATORY_CARE_PROVIDER_SITE_OTHER): Payer: 59 | Admitting: Sports Medicine

## 2023-02-06 ENCOUNTER — Encounter: Payer: Self-pay | Admitting: Sports Medicine

## 2023-02-06 VITALS — BP 114/71 | HR 70 | Ht 65.0 in | Wt 186.0 lb

## 2023-02-06 DIAGNOSIS — M5030 Other cervical disc degeneration, unspecified cervical region: Secondary | ICD-10-CM | POA: Diagnosis not present

## 2023-02-06 DIAGNOSIS — M5136 Other intervertebral disc degeneration, lumbar region: Secondary | ICD-10-CM | POA: Diagnosis not present

## 2023-02-06 DIAGNOSIS — E785 Hyperlipidemia, unspecified: Secondary | ICD-10-CM | POA: Diagnosis not present

## 2023-02-06 DIAGNOSIS — N3281 Overactive bladder: Secondary | ICD-10-CM | POA: Diagnosis not present

## 2023-02-06 DIAGNOSIS — M51369 Other intervertebral disc degeneration, lumbar region without mention of lumbar back pain or lower extremity pain: Secondary | ICD-10-CM

## 2023-02-06 DIAGNOSIS — Z0000 Encounter for general adult medical examination without abnormal findings: Secondary | ICD-10-CM | POA: Diagnosis not present

## 2023-02-06 LAB — POCT URINALYSIS DIP (CLINITEK)
Bilirubin, UA: NEGATIVE
Blood, UA: NEGATIVE
Glucose, UA: NEGATIVE mg/dL
Ketones, POC UA: NEGATIVE mg/dL
Leukocytes, UA: NEGATIVE
Nitrite, UA: NEGATIVE
POC PROTEIN,UA: NEGATIVE
Spec Grav, UA: <=1.005 — AB (ref 1.010–1.025)
Urobilinogen, UA: 0.2 E.U./dL
pH, UA: 6 (ref 5.0–8.0)

## 2023-02-06 MED ORDER — MIRABEGRON ER 25 MG PO TB24: 25.0000 mg | ORAL_TABLET | Freq: Every day | ORAL | 3 refills | Status: DC

## 2023-02-06 NOTE — Progress Notes (Signed)
Subjective:    CC: Annual Physical Exam  HPI:  This patient is here for their annual physical  I reviewed the past medical history, family history, social history, surgical history, and allergies today and no changes were needed.  Please see the problem list section below in epic for further details.  Past Medical History: Past Medical History:  Diagnosis Date   Obesity (BMI 30-39.9)    Past Surgical History: Past Surgical History:  Procedure Laterality Date   ANKLE FRACTURE SURGERY     Social History: Social History   Socioeconomic History   Marital status: Single    Spouse name: Not on file   Number of children: Not on file   Years of education: Not on file   Highest education level: Not on file  Occupational History   Occupation: Biomedical scientist  Tobacco Use   Smoking status: Former    Current packs/day: 0.50    Average packs/day: 0.5 packs/day for 26.0 years (13.0 ttl pk-yrs)    Types: Cigarettes   Smokeless tobacco: Never  Substance and Sexual Activity   Alcohol use: No    Alcohol/week: 0.0 standard drinks of alcohol   Drug use: No   Sexual activity: Yes    Partners: Male  Other Topics Concern   Not on file  Social History Narrative   Not on file   Social Determinants of Health   Financial Resource Strain: Not on file  Food Insecurity: Not on file  Transportation Needs: Not on file  Physical Activity: Not on file  Stress: Not on file  Social Connections: Unknown (11/26/2022)   [facility]   Social Network    Social Network: Not on file   Family History: Family History  Problem Relation Age of Onset   Hyperlipidemia Mother    Cancer Maternal Grandmother        pancreatic   Heart attack Maternal Grandfather    Allergies: No Known Allergies Medications: See med rec.  Review of Systems: No headache, visual changes, nausea, vomiting, diarrhea, constipation, dizziness, abdominal pain, skin rash, fevers, chills, night  sweats, swollen lymph nodes, weight loss, chest pain, body aches, joint swelling, muscle aches, shortness of breath, mood changes, visual or auditory hallucinations.  Objective:    General: Well Developed, well nourished, and in no acute distress.  Neuro: Alert and oriented x3, extra-ocular muscles intact, sensation grossly intact. Cranial nerves II through XII are intact, motor, sensory, and coordinative functions are all intact. HEENT: Normocephalic, atraumatic, pupils equal round reactive to light, neck supple, no masses, no lymphadenopathy, thyroid nonpalpable. Oropharynx, nasopharynx, external ear canals are unremarkable. Skin: Warm and dry, no rashes noted.  Cardiac: Regular rate and rhythm, no murmurs rubs or gallops.  Respiratory: Clear to auscultation bilaterally. Not using accessory muscles, speaking in full sentences.  Abdominal: Soft, nontender, nondistended, positive bowel sounds, no masses, no organomegaly.  Musculoskeletal: Shoulder, elbow, wrist, hip, knee, ankle stable, and with full range of motion.  Impression and Recommendations:    The patient was counselled, risk factors were discussed, anticipatory guidance given.  Annual physical exam Annual physical as above, up-to-date on Pap smear, she says she got it back in 2023 and it was normal, we will mark this down, OB/GYN is [facility]. Return to see me in a year.  DDD (degenerative disc disease), cervical Did not respond to prednisone, she will get more consistent with her home conditioning, if insufficient improvement after 6 weeks we will proceed with MRI.  DDD (degenerative  disc disease), lumbar Did not respond to prednisone, she will get more consistent with her home conditioning, if insufficient improvement after 6 weeks we will proceed with MRI.  Overactive bladder Voiding multiple times per day, over 4 times in a row, she gets the urge and then has to go, no burning or pain. Suspect overactive bladder, urinalysis  today was negative. Adding Myrbetriq, we need to use Myrbetriq due to its lack of anticholinergic side effects. Return to see me after 4 weeks of treatment.   ____________________________________________ Ihor Austin. Benjamin Stain, M.D., ABFM., CAQSM., AME. Primary Care and Sports Medicine [place] [facility]  Adjunct Professor of Family Medicine  [place] [employer] [facility] [employer]  Restaurant manager, fast food

## 2023-02-06 NOTE — Assessment & Plan Note (Signed)
Did not respond to prednisone, she will get more consistent with her home conditioning, if insufficient improvement after 6 weeks we will proceed with MRI.

## 2023-02-06 NOTE — Addendum Note (Signed)
Addended by: Carren Rang A on: 02/06/2023 10:43 AM   Modules accepted: Orders

## 2023-02-06 NOTE — Assessment & Plan Note (Signed)
Voiding multiple times per day, over 4 times in a row, she gets the urge and then has to go, no burning or pain. Suspect overactive bladder, urinalysis today was negative. Adding Myrbetriq, we need to use Myrbetriq due to its lack of anticholinergic side effects. Return to see me after 4 weeks of treatment.

## 2023-02-06 NOTE — Assessment & Plan Note (Signed)
Annual physical as above, up-to-date on Pap smear, she says she got it back in 2023 and it was normal, we will mark this down, OB/GYN is [facility]. Return to see me in a year.

## 2023-02-09 LAB — LIPID PANEL
Chol/HDL Ratio: 3.8 ratio (ref 0.0–4.4)
Cholesterol, Total: 194 mg/dL (ref 100–199)
HDL: 51 mg/dL (ref >39)
LDL Chol Calc (NIH): 124 mg/dL — ABNORMAL HIGH (ref 0–99)
Triglycerides: 103 mg/dL (ref 0–149)
VLDL Cholesterol Cal: 19 mg/dL (ref 5–40)

## 2023-02-09 LAB — COMPREHENSIVE METABOLIC PANEL WITH GFR
ALT: 17 IU/L (ref 0–32)
AST: 22 IU/L (ref 0–40)
Albumin: 4.7 g/dL (ref 3.9–4.9)
Alkaline Phosphatase: 61 IU/L (ref 44–121)
BUN/Creatinine Ratio: 10 (ref 9–23)
BUN: 10 mg/dL (ref 6–24)
Bilirubin Total: 0.4 mg/dL (ref 0.0–1.2)
CO2: 21 mmol/L (ref 20–29)
Calcium: 9.8 mg/dL (ref 8.7–10.2)
Chloride: 100 mmol/L (ref 96–106)
Creatinine, Ser: 0.96 mg/dL (ref 0.57–1.00)
Globulin, Total: 2.8 g/dL (ref 1.5–4.5)
Glucose: 100 mg/dL — ABNORMAL HIGH (ref 70–99)
Potassium: 4.9 mmol/L (ref 3.5–5.2)
Sodium: 139 mmol/L (ref 134–144)
Total Protein: 7.5 g/dL (ref 6.0–8.5)
eGFR: 73 mL/min/1.73 (ref >59)

## 2023-02-09 LAB — HEMOGLOBIN A1C
Est. average glucose Bld gHb Est-mCnc: 105 mg/dL
Hgb A1c MFr Bld: 5.3 % (ref 4.8–5.6)

## 2023-02-09 LAB — CBC
Hematocrit: 42.2 % (ref 34.0–46.6)
Hemoglobin: 13.6 g/dL (ref 11.1–15.9)
MCH: 32.1 pg (ref 26.6–33.0)
MCHC: 32.2 g/dL (ref 31.5–35.7)
MCV: 100 fL — ABNORMAL HIGH (ref 79–97)
Platelets: 271 x10E3/uL (ref 150–450)
RBC: 4.24 x10E6/uL (ref 3.77–5.28)
RDW: 11.2 % — ABNORMAL LOW (ref 11.7–15.4)
WBC: 7.3 x10E3/uL (ref 3.4–10.8)

## 2023-02-09 LAB — TSH: TSH: 1.1 u[IU]/mL (ref 0.450–4.500)

## 2023-03-08 ENCOUNTER — Ambulatory Visit: Payer: 59 | Admitting: Sports Medicine

## 2023-03-08 ENCOUNTER — Encounter: Payer: Self-pay | Admitting: Sports Medicine

## 2023-03-08 VITALS — BP 118/72 | HR 73 | Resp 20 | Ht 65.0 in | Wt 186.0 lb

## 2023-03-08 DIAGNOSIS — N3281 Overactive bladder: Secondary | ICD-10-CM | POA: Diagnosis not present

## 2023-03-08 DIAGNOSIS — M5030 Other cervical disc degeneration, unspecified cervical region: Secondary | ICD-10-CM

## 2023-03-08 DIAGNOSIS — M5136 Other intervertebral disc degeneration, lumbar region: Secondary | ICD-10-CM

## 2023-03-08 DIAGNOSIS — M51369 Other intervertebral disc degeneration, lumbar region without mention of lumbar back pain or lower extremity pain: Secondary | ICD-10-CM

## 2023-03-08 MED ORDER — MIRABEGRON ER 50 MG PO TB24
50.0000 mg | ORAL_TABLET | Freq: Every day | ORAL | 11 refills | Status: DC
Start: 2023-03-08 — End: 2023-06-25

## 2023-03-08 NOTE — Assessment & Plan Note (Signed)
Doing better.   

## 2023-03-08 NOTE — Assessment & Plan Note (Signed)
Still with significant discomfort in spite of conservative treatment, at this point she has failed greater than 6 weeks of home physical therapy, adding x-rays and an MRI, this is for epidural planning.

## 2023-03-08 NOTE — Assessment & Plan Note (Signed)
Voiding multiple times through the day, she did have a slight improvement with Myrbetriq 25, no side effects. Increasing Myrbetriq to 50 mg. She will also avoid consumption of liquids within 2 to 3 hours of bedtime.

## 2023-03-08 NOTE — Progress Notes (Signed)
    Procedures performed today:    None.  Independent interpretation of notes and tests performed by another provider:   None.  Brief History, Exam, Impression, and Recommendations:    DDD (degenerative disc disease), cervical Still with significant discomfort in spite of conservative treatment, at this point she has failed greater than 6 weeks of home physical therapy, adding x-rays and an MRI, this is for epidural planning.  DDD (degenerative disc disease), lumbar Doing better.  Overactive bladder Voiding multiple times through the day, she did have a slight improvement with Myrbetriq 25, no side effects. Increasing Myrbetriq to 50 mg. She will also avoid consumption of liquids within 2 to 3 hours of bedtime.    ____________________________________________ Ihor Austin. Benjamin Stain, M.D., ABFM., CAQSM., AME. Primary Care and Sports Medicine [place] [facility]  Adjunct Professor of Family Medicine  [place] [employer] [facility] [employer]  Restaurant manager, fast food

## 2023-03-25 ENCOUNTER — Ambulatory Visit (INDEPENDENT_AMBULATORY_CARE_PROVIDER_SITE_OTHER): Payer: 59

## 2023-03-25 DIAGNOSIS — M5030 Other cervical disc degeneration, unspecified cervical region: Secondary | ICD-10-CM

## 2023-04-08 ENCOUNTER — Encounter: Payer: Self-pay | Admitting: Sports Medicine

## 2023-04-08 ENCOUNTER — Ambulatory Visit: Payer: 59 | Admitting: Sports Medicine

## 2023-04-08 VITALS — BP 119/76 | HR 94 | Ht 65.0 in | Wt 189.0 lb

## 2023-04-08 DIAGNOSIS — M5030 Other cervical disc degeneration, unspecified cervical region: Secondary | ICD-10-CM

## 2023-04-08 MED ORDER — PREDNISONE 10 MG (48) PO TBPK: ORAL_TABLET | Freq: Every day | ORAL | 0 refills | Status: DC

## 2023-04-08 NOTE — Assessment & Plan Note (Signed)
Pleasant 47 year old female, she has been having chronic axial neck pain, paresthesias in both hands, mostly C6 and C7 distribution. Recently after failure of conservative treatment including steroids and PT we obtained an MRI, the MRI did not fortunately show myelomalacia at the C4-C5 level, she does have multilevel degenerative disc disease. On physical exam she had good strength and motion but she did have a positive Hoffman's reflex bilaterally. Due to the myelomalacia as well as abnormal reflexes we do need an urgent neurosurgical consult.

## 2023-04-08 NOTE — Progress Notes (Signed)
    Procedures performed today:    None.  Independent interpretation of notes and tests performed by another provider:   Personally reviewed cervical spine MRI, multilevel DDD worse C4-C5, there is associated myelomalacia at this level.  Brief History, Exam, Impression, and Recommendations:    DDD (degenerative disc disease), cervical Pleasant 47 year old female, she has been having chronic axial neck pain, paresthesias in both hands, mostly C6 and C7 distribution. Recently after failure of conservative treatment including steroids and PT we obtained an MRI, the MRI did not fortunately show myelomalacia at the C4-C5 level, she does have multilevel degenerative disc disease. On physical exam she had good strength and motion but she did have a positive Hoffman's reflex bilaterally. Due to the myelomalacia as well as abnormal reflexes we do need an urgent neurosurgical consult.    ____________________________________________ Ihor Austin. Benjamin Stain, M.D., ABFM., CAQSM., AME. Primary Care and Sports Medicine [place] [facility]  Adjunct Professor of Family Medicine  [place] [employer] [facility] [employer]  Restaurant manager, fast food

## 2023-04-25 DIAGNOSIS — G959 Disease of spinal cord, unspecified: Secondary | ICD-10-CM | POA: Insufficient documentation

## 2023-06-24 ENCOUNTER — Ambulatory Visit: Payer: Self-pay | Admitting: Sports Medicine

## 2023-06-24 NOTE — Telephone Encounter (Signed)
Attempted to Called patient back to follow up on previous nurse triage: attempted to provide pt with advice to go to ED if s/s become worse: no answer: left voicemail: left E2C2 phone number for pt to call back.

## 2023-06-24 NOTE — Telephone Encounter (Signed)
Copied from CRM [id number]. Topic: Clinical - Red Word Triage >> Jun 24, 2023  3:15 PM Joanette Gula wrote: Red Word that prompted transfer to Nurse Triage: Irregular heart rate 88-102 elevated Blood Pressure. Rapid heart beat.  Chief Complaint: increased heart rate Symptoms: increased heart rate, increased BP, rapid breathing at at times Frequency: started around  Saturday Pertinent Negatives: Patient denies chest pain, no n/v Disposition: [] ED /[] Urgent Care (no appt availability in office) / [x] Appointment(In office/virtual)/ []  [facility]/ [] Home Care/ [] Refused Recommended Disposition /[] [facility]/ []  Follow-up with PCP Additional Notes: pt & nurse at pt job ([employer]) called due to nurse being concerned.  Nurse stated pt is suppose to have neck surgery soon and would like pt to see provider due to BP 143/93, irregular HR, HR range from 88-102 and currently at 91, pt having some SOB/rapid breathing with increased HR. Nurse scheduled in person appt with Provider: Nurse attempted to give advice r/t go to ED if worse however pt phone disconnected.  Nurse attempted to call pt back but no anseer and left message to return call.  Reason for Disposition  Heart rhythm alert (e.g., "you have irregular heartbeat") from personal wearable device (e.g., Apple Watch)  Answer Assessment - Initial Assessment Questions 1. DESCRIPTION: "Please describe your heart rate or heartbeat that you are having" (e.g., fast/slow, regular/irregular, skipped or extra beats, "palpitations")    Heart rate 88-102 and at present time 91 pulse 2. ONSET: "When did it start?" (Minutes, hours or days)      Started around Saturday  3. DURATION: "How long does it last" (e.g., seconds, minutes, hours)     ongoing 4. PATTERN "Does it come and go, or has it been constant since it started?"  "Does it get worse with exertion?"   "Are you feeling it now?"     Tired, low appetitive 5. TAP: "Using your  hand, can you tap out what you are feeling on a chair or table in front of you, so that I can hear?" (Note: not all patients can do this)       N/a 6. HEART RATE: "Can you tell me your heart rate?" "How many beats in 15 seconds?"  (Note: not all patients can do this)       91 7. RECURRENT SYMPTOM: "Have you ever had this before?" If Yes, ask: "When was the last time?" and "What happened that time?"      no 8. CAUSE: "What do you think is causing the palpitations?"     unknown 9. CARDIAC HISTORY: "Do you have any history of heart disease?" (e.g., heart attack, angina, bypass surgery, angioplasty, arrhythmia)      N.a 10. OTHER SYMPTOMS: "Do you have any other symptoms?" (e.g., dizziness, chest pain, sweating, difficulty breathing)       SOB with increase heart rate 02@ 98 to 100 and lungs sounds clear per Deberah Pelton nurse at [employer] 11. PREGNANCY: "Is there any chance you are pregnant?" "When was your last menstrual period?"       N/a  Protocols used: Heart Rate and Heartbeat Questions-A-AH

## 2023-06-25 ENCOUNTER — Ambulatory Visit (HOSPITAL_BASED_OUTPATIENT_CLINIC_OR_DEPARTMENT_OTHER): Payer: 59 | Admitting: Family Medicine

## 2023-06-25 ENCOUNTER — Encounter (HOSPITAL_BASED_OUTPATIENT_CLINIC_OR_DEPARTMENT_OTHER): Payer: Self-pay | Admitting: Family Medicine

## 2023-06-25 ENCOUNTER — Ambulatory Visit (INDEPENDENT_AMBULATORY_CARE_PROVIDER_SITE_OTHER): Payer: 59

## 2023-06-25 VITALS — BP 130/84 | HR 76 | Ht 65.0 in | Wt 191.0 lb

## 2023-06-25 DIAGNOSIS — R002 Palpitations: Secondary | ICD-10-CM

## 2023-06-25 DIAGNOSIS — R0602 Shortness of breath: Secondary | ICD-10-CM

## 2023-06-25 DIAGNOSIS — R072 Precordial pain: Secondary | ICD-10-CM

## 2023-06-25 LAB — TROPONIN T: Troponin T (Highly Sensitive): <6 ng/L (ref 0–14)

## 2023-06-25 LAB — SPECIMEN STATUS REPORT

## 2023-06-25 LAB — D-DIMER, QUANTITATIVE: D-DIMER: 0.2 mg{FEU}/L (ref 0.00–0.49)

## 2023-06-25 NOTE — Patient Instructions (Signed)
Follow up with your PCP if no improvement in your symptoms.   If symptoms return, please go to ER.

## 2023-06-25 NOTE — Progress Notes (Signed)
Hi Colleen Miller,  Your chest xray is normal. We will let you know what your labwork indicates as well.

## 2023-06-25 NOTE — Progress Notes (Signed)
D Dimer and Troponin unremarkable.

## 2023-06-25 NOTE — Telephone Encounter (Signed)
Patient seen at [facility] today 06/25/23 .

## 2023-06-25 NOTE — Progress Notes (Signed)
Acute Care Office Visit  Subjective:   Colleen Miller [birth year] 06/25/2023  Chief Complaint  Patient presents with   Tachycardia    Patient states she has been having problems with palpitations, rapid heart rate, and some SOB for the past 3 days.    HPI: CHEST PAIN Colleen Miller presents complaining of chest pain. Pt reports pain began on Friday night 06/21/23.  She felt very tired and upon getting up to go to the restroom, she noticed palpitations, racing heart rate and shortness of breath. She states she has noticed episodes of symptoms lasting approx. 15-20 minutes. She states episodes most frequently occur when changing positions. Reports pain to mid to left chest with radiation to her back. She denies diaphoresis, nausea or vomiting. She has noticed decreased appetite. Denies recent illness or exposure. Hx of smoking cigarettes. She does currently vape daily.   She is currently scheduled for C4-C5 cervical spine discectomy on 12/202/24.   Symptoms Fever/chills: no  Nausea/vomiting: no  Diaphoresis: no  Shortness of breath: yes  Cough: no  Edema: no  Orthopnea: no  Dizziness: yes  Palpitations: yes  Syncope: no  Indigestion: yes    Red Flags Worse with exertion: no Recent Immobility: no and denies recent travel Cancer history: no  Tearing/radiation to back: no   The 10-year ASCVD risk score (Arnett DK, et al., 2019) is: 1%   Values used to calculate the score:     Age: 47 years     Sex: Female     Is Non-Hispanic African American: No     Diabetic: No     Tobacco smoker: No     Systolic Blood Pressure: 130 mmHg     Is BP treated: No     HDL Cholesterol: 51 mg/dL     Total Cholesterol: 194 mg/dL   The following portions of the patient's history were reviewed and updated as appropriate: past medical history, past surgical history, family history, social history, allergies, medications, and problem list.   Patient Active Problem List   Diagnosis Date  Noted   Cervical myelopathy (HCC) 04/25/2023   Overactive bladder 02/06/2023   Hyperlipidemia 07/21/2021   Leukocytosis 07/21/2021   Impingement syndrome, shoulder, left 07/19/2021   DDD (degenerative disc disease), cervical 10/16/2018   DDD (degenerative disc disease), lumbar 10/16/2018   Menometrorrhagia 10/16/2018   Migraine headache without aura 11/15/2015   Annual physical exam 09/27/2014   Past Medical History:  Diagnosis Date   Obesity (BMI 30-39.9)    Past Surgical History:  Procedure Laterality Date   ANKLE FRACTURE SURGERY     Family History  Problem Relation Age of Onset   Hyperlipidemia Mother    Cancer Maternal Grandmother        pancreatic   Heart attack Maternal Grandfather    Outpatient Medications Prior to Visit  Medication Sig Dispense Refill   Levonorgest-Eth Estradiol-Iron 0.1-20 MG-MCG(21) TABS Take 1 tablet by mouth daily.     meloxicam (MOBIC) 15 MG tablet One tab PO every 24 hours with a meal for 2 weeks, then once every 24 hours prn pain. 30 tablet 3   Multiple Vitamins-Minerals (ONE-A-DAY WOMENS PO) Take by mouth.     cyclobenzaprine (FLEXERIL) 10 MG tablet Take 1 tablet (10 mg total) by mouth 3 (three) times daily as needed for muscle spasms. 30 tablet 0   mirabegron ER (MYRBETRIQ) 50 MG TB24 tablet Take 1 tablet (50 mg total) by mouth daily. 30 tablet 11  predniSONE (STERAPRED UNI-PAK 48 TAB) 10 MG (48) TBPK tablet Take by mouth daily. 12-day taper pack, use as directed for taper 1 tablet 0   No facility-administered medications prior to visit.   No Known Allergies   ROS: A complete ROS was performed with pertinent positives/negatives noted in the HPI. The remainder of the ROS are negative.    Objective:   Today's Vitals   06/25/23 0901  BP: 130/84  Pulse: 76  SpO2: 100%  Weight: 191 lb (86.6 kg)  Height: 5\' 5"  (1.651 m)    GENERAL: Well-appearing, in NAD. Well nourished.  SKIN: Pink, warm and dry.  Head: Normocephalic. NECK:  Trachea midline. Full ROM w/o pain or tenderness. RESPIRATORY: Chest wall symmetrical. Respirations even and non-labored. Breath sounds clear to auscultation bilaterally.  CARDIAC: S1, S2 present, regular rate and rhythm without murmur or gallops. Peripheral pulses 2+ bilaterally. Carotid arteries without bruit or thrill. No displaced PMI. No reproducible chest pain with palpation.  GI: Abdomen soft, non-tender. Normoactive bowel sounds. No rebound tenderness. No hepatomegaly or splenomegaly. No CVA tenderness. No palpable or pulsatile mass.  MSK: Muscle tone and strength appropriate for age.  EXTREMITIES: Without clubbing, cyanosis, or edema.  NEUROLOGIC: No motor or sensory deficits. Steady, even gait. C2-C12 intact.  PSYCH/MENTAL STATUS: Alert, oriented x 3. Cooperative, appropriate mood and affect.   EKG: Sinus rhythm with short PR Otherwise normal ECG When compared with ECG of 29-Nov-2016 15:03, PR interval has decreased.    Assessment & Plan:  1. Palpitations 2. Precordial pain 3. Shortness of breath Will rule out electrolyte, thyroid, anemia cause of palpitations with labs today. Recent slight elevation in Hgb without cause. Possibly smoking related. Will obtain stat D Dimer and Troponin with labs and obtain chest xray due to [facility]. Pending labs, discussed possibility of Zio patch with patient and she was agreeable. Discussed red flag signs and symptoms to go to the ER for and she verbalized understanding. Recommend follow up with her PCP as well.   - CBC with Differential/Platelet - Comprehensive metabolic panel - Lipid panel - TSH - Iron, TIBC and Ferritin Panel - Brain natriuretic peptide - Vitamin B12 - Troponin T - D-Dimer, Quantitative - DG Chest 2 View; Future  Lab Orders         CBC with Differential/Platelet         Comprehensive metabolic panel         Lipid panel         TSH         Iron, TIBC and Ferritin Panel         Brain natriuretic peptide         Vitamin  B12         Troponin T         D-Dimer, Quantitative      Return if symptoms worsen or fail to improve.   A total of 45 minutes were spent on this encounter today including face to face, ordering and reviewing labs, reviewing and comparison of patient's EKGs, reviewing patient's previous records from [facility], documenting in the record and ordering  labs, medications and chest xray.   Patient to reach out to office if new, worrisome, or unresolved symptoms arise or if no improvement in patient's condition. Patient verbalized understanding and is agreeable to treatment plan. All questions answered to patient's satisfaction.    Hilbert Bible, [place]

## 2023-06-26 ENCOUNTER — Ambulatory Visit: Payer: 59 | Admitting: Physician Assistant

## 2023-06-26 LAB — COMPREHENSIVE METABOLIC PANEL
ALT: 20 [IU]/L (ref 0–32)
AST: 21 [IU]/L (ref 0–40)
Albumin: 4.7 g/dL (ref 3.9–4.9)
Alkaline Phosphatase: 60 [IU]/L (ref 44–121)
BUN/Creatinine Ratio: 14 (ref 9–23)
BUN: 15 mg/dL (ref 6–24)
Bilirubin Total: 0.6 mg/dL (ref 0.0–1.2)
CO2: 23 mmol/L (ref 20–29)
Calcium: 10.4 mg/dL — ABNORMAL HIGH (ref 8.7–10.2)
Chloride: 103 mmol/L (ref 96–106)
Creatinine, Ser: 1.06 mg/dL — ABNORMAL HIGH (ref 0.57–1.00)
Globulin, Total: 2.8 g/dL (ref 1.5–4.5)
Glucose: 111 mg/dL — ABNORMAL HIGH (ref 70–99)
Potassium: 5 mmol/L (ref 3.5–5.2)
Sodium: 142 mmol/L (ref 134–144)
Total Protein: 7.5 g/dL (ref 6.0–8.5)
eGFR: 65 mL/min/{1.73_m2} (ref >59)

## 2023-06-26 LAB — IRON,TIBC AND FERRITIN PANEL
Ferritin: 166 ng/mL — ABNORMAL HIGH (ref 15–150)
Iron Saturation: 69 % — ABNORMAL HIGH (ref 15–55)
Iron: 230 ug/dL — ABNORMAL HIGH (ref 27–159)
Total Iron Binding Capacity: 333 ug/dL (ref 250–450)
UIBC: 103 ug/dL — ABNORMAL LOW (ref 131–425)

## 2023-06-26 LAB — LIPID PANEL
Chol/HDL Ratio: 4.5 {ratio} — ABNORMAL HIGH (ref 0.0–4.4)
Cholesterol, Total: 218 mg/dL — ABNORMAL HIGH (ref 100–199)
HDL: 48 mg/dL (ref >39)
LDL Chol Calc (NIH): 151 mg/dL — ABNORMAL HIGH (ref 0–99)
Triglycerides: 105 mg/dL (ref 0–149)
VLDL Cholesterol Cal: 19 mg/dL (ref 5–40)

## 2023-06-26 LAB — CBC WITH DIFFERENTIAL/PLATELET
Basophils Absolute: 0.1 10*3/uL (ref 0.0–0.2)
Basos: 1 %
EOS (ABSOLUTE): 0.1 10*3/uL (ref 0.0–0.4)
Eos: 2 %
Hematocrit: 46.6 % (ref 34.0–46.6)
Hemoglobin: 15.6 g/dL (ref 11.1–15.9)
Immature Grans (Abs): 0 10*3/uL (ref 0.0–0.1)
Immature Granulocytes: 0 %
Lymphocytes Absolute: 1.7 10*3/uL (ref 0.7–3.1)
Lymphs: 24 %
MCH: 33.5 pg — ABNORMAL HIGH (ref 26.6–33.0)
MCHC: 33.5 g/dL (ref 31.5–35.7)
MCV: 100 fL — ABNORMAL HIGH (ref 79–97)
Monocytes Absolute: 0.6 10*3/uL (ref 0.1–0.9)
Monocytes: 8 %
Neutrophils Absolute: 4.6 10*3/uL (ref 1.4–7.0)
Neutrophils: 65 %
Platelets: 345 10*3/uL (ref 150–450)
RBC: 4.66 x10E6/uL (ref 3.77–5.28)
RDW: 11.6 % — ABNORMAL LOW (ref 11.7–15.4)
WBC: 7.1 10*3/uL (ref 3.4–10.8)

## 2023-06-26 LAB — BRAIN NATRIURETIC PEPTIDE: BNP: 12.4 pg/mL (ref 0.0–100.0)

## 2023-06-26 LAB — TSH: TSH: 1.77 u[IU]/mL (ref 0.450–4.500)

## 2023-06-26 LAB — VITAMIN B12: Vitamin B-12: 485 pg/mL (ref 232–1245)

## 2023-06-27 NOTE — Progress Notes (Signed)
Hi Colleen Miller,  Your lab work does not show a definitive cause for your chest pain or palpitations. Your iron is slightly elevated and could be do to inflammation or other cause. I would recommend following up with your PCP Dr. Benjamin Stain to do further evaluation of this. If you continue to have chest pains, Cardiology may also be best to further evaluate and find the cause.

## 2023-06-28 LAB — D-DIMER, QUANTITATIVE

## 2023-06-28 LAB — TROPONIN T

## 2023-07-19 ENCOUNTER — Ambulatory Visit: Payer: 59 | Admitting: Sports Medicine

## 2023-07-19 VITALS — BP 119/85 | HR 84 | Ht 65.0 in | Wt 189.0 lb

## 2023-07-19 DIAGNOSIS — R002 Palpitations: Secondary | ICD-10-CM

## 2023-07-19 MED ORDER — METOPROLOL SUCCINATE ER 25 MG PO TB24: 25.0000 mg | ORAL_TABLET | Freq: Every day | ORAL | 3 refills | Status: DC

## 2023-07-19 NOTE — Progress Notes (Signed)
    Procedures performed today:    12 ECG performed and interpreted by me, typically normal sinus rhythm with occasional interspersed premature ventricular contractions with expected compensatory pause, normal axis, normal PR interval and ST.  Independent interpretation of notes and tests performed by another provider:   None.  Brief History, Exam, Impression, and Recommendations:    Palpitations Very pleasant 48 year old female, recently status post ACDF. For the past 3 weeks or so she has had increasing sensations of her heart beating. She describes skipped beats and extra beats, heart racing, occasional shortness of breath during these episodes. These are typically at rest and are not exacerbated by exertion. No chest pain, nausea, diaphoresis. She has stopped vaping, she does drink coffee and caffeinated soda. She did see an occupational health nurse and then a nurse practitioner at [facility], a very appropriate lab investigation was done and is in the chart and normal. Twelve-lead ECG per report was normal. Chest x-ray is normal. Vitals are stable today, on exam she does have an irregular rate. I do suspect PVCs or PACs, we will try to capture this on an ECG today. While watching her tracing I was able to capture a PVC, they were very intermittent with expected compensatory pauses. We will go ahead and get a echocardiogram and start metoprolol . Patient was reassured. I do not think she is having anginal symptoms so stress testing is not needed here. Return me in 2 weeks to reevaluate.  I spent 40 minutes of total time managing this patient today, this includes chart review, face to face, and non-face to face time.  This was separate from the time spent performing the ECG.  ____________________________________________ Debby PARAS. Curtis, M.D., ABFM., CAQSM., AME. Primary Care and Sports Medicine [facility]  Adjunct Professor of [facility] Medicine   [employer]  Restaurant Manager, [employer]

## 2023-07-19 NOTE — Assessment & Plan Note (Addendum)
 Very pleasant 48 year old female, recently status post ACDF. For the past 3 weeks or so she has had increasing sensations of her heart beating. She describes skipped beats and extra beats, heart racing, occasional shortness of breath during these episodes. These are typically at rest and are not exacerbated by exertion. No chest pain, nausea, diaphoresis. She has stopped vaping, she does drink coffee and caffeinated soda. She did see an occupational health nurse and then a nurse practitioner at [facility], a very appropriate lab investigation was done and is in the chart and normal. Twelve-lead ECG per report was normal. Chest x-ray is normal. Vitals are stable today, on exam she does have an irregular rate. I do suspect PVCs or PACs, we will try to capture this on an ECG today. While watching her tracing I was able to capture a PVC, they were very intermittent with expected compensatory pauses. We will go ahead and get a echocardiogram and start metoprolol . Patient was reassured. I do not think she is having anginal symptoms so stress testing is not needed here. Return me in 2 weeks to reevaluate.

## 2023-07-23 ENCOUNTER — Other Ambulatory Visit: Payer: Self-pay | Admitting: Sports Medicine

## 2023-07-23 DIAGNOSIS — R9431 Abnormal electrocardiogram [ECG] [EKG]: Secondary | ICD-10-CM

## 2023-07-23 DIAGNOSIS — R002 Palpitations: Secondary | ICD-10-CM

## 2023-08-06 ENCOUNTER — Ambulatory Visit: Payer: 59 | Admitting: Sports Medicine

## 2023-08-19 ENCOUNTER — Ambulatory Visit (HOSPITAL_BASED_OUTPATIENT_CLINIC_OR_DEPARTMENT_OTHER): Payer: 59 | Attending: Sports Medicine

## 2023-10-30 ENCOUNTER — Other Ambulatory Visit: Payer: Self-pay

## 2023-10-30 DIAGNOSIS — R002 Palpitations: Secondary | ICD-10-CM

## 2023-10-30 MED ORDER — METOPROLOL SUCCINATE ER 25 MG PO TB24: 25.0000 mg | ORAL_TABLET | Freq: Every day | ORAL | 3 refills | Status: DC

## 2023-10-31 ENCOUNTER — Other Ambulatory Visit: Payer: Self-pay

## 2023-11-12 ENCOUNTER — Telehealth: Payer: Self-pay | Admitting: Sports Medicine

## 2023-11-12 ENCOUNTER — Telehealth: Payer: Self-pay

## 2023-11-12 ENCOUNTER — Other Ambulatory Visit: Payer: Self-pay | Admitting: Sports Medicine

## 2023-11-12 DIAGNOSIS — Z0000 Encounter for general adult medical examination without abnormal findings: Secondary | ICD-10-CM

## 2023-11-12 DIAGNOSIS — R002 Palpitations: Secondary | ICD-10-CM

## 2023-11-12 NOTE — Telephone Encounter (Signed)
 Copied from CRM [id number]. Topic: Clinical - Request for Lab/Test Order >> Nov 12, 2023 12:10 PM Retta Caster wrote: Reason for CRM: Patient calling to request MAM order for her yearly check. Need call back when updated [phone]

## 2023-11-12 NOTE — Telephone Encounter (Signed)
 Not a problem, orders placed, she can schedule downstairs at her leisure.

## 2023-11-12 NOTE — Telephone Encounter (Signed)
 Copied from CRM [id number]. Topic: General - Other >> Nov 12, 2023 12:11 PM Retta Caster wrote: Reason for CRM: DG Cervical Spine Complete (Order [id number]) US  PELVIC COMPLETE W TRANSVAGINAL AND TORSION R/O (Order [id number])  Patient calling to to clarify why she needs this done. Need call back when [phone]

## 2023-11-12 NOTE — Telephone Encounter (Signed)
 Copied from CRM [id number]. Topic: Clinical - Medication Refill >> Nov 12, 2023 12:09 PM Retta Caster wrote: Most Recent Primary Care Visit:  Provider: Gean Keels  Department: [facility]  Visit Type: ACUTE  Date: 07/19/2023  Medication: metoprolol  succinate (TOPROL -XL) 25 MG 24 hr tablet   Has the patient contacted their pharmacy? Yes (Agent: If no, request that the patient contact the pharmacy for the refill. If patient does not wish to contact the pharmacy document the reason why and proceed with request.) (Agent: If yes, when and what did the pharmacy advise?)  Is this the correct pharmacy for this prescription? Yes If no, delete pharmacy and type the correct one.  This is the patient's preferred pharmacy:  [id number] - [place], [place] - [address] AT [facility] OF [address] [place] [address] Phone: [phone] Fax: [phone]   Has the prescription been filled recently? No  Is the patient out of the medication? Yes  Has the patient been seen for an appointment in the last year OR does the patient have an upcoming appointment? Yes  Can we respond through MyChart? Yes  Agent: Please be advised that Rx refills may take up to 3 business days. We ask that you follow-up with your pharmacy.

## 2023-11-12 NOTE — Telephone Encounter (Signed)
 In checking patient chart  Shows this was ordered on 10/16/2018 Office note for this date state Menometrorrhagia History of simple right and hemorrhagic left ovarian cyst. I am going to check some labs, she is having irregular cycles. I would also like another set of ultrasounds. Is this still needed ?

## 2023-11-12 NOTE — Telephone Encounter (Signed)
 That was 5 years ago.  It was for her regular cycles and history of hemorrhagic ovarian cysts.  Its up to her, if her cycles have normalized then no need.

## 2023-11-13 ENCOUNTER — Other Ambulatory Visit: Payer: Self-pay | Admitting: Sports Medicine

## 2023-11-13 DIAGNOSIS — R928 Other abnormal and inconclusive findings on diagnostic imaging of breast: Secondary | ICD-10-CM | POA: Insufficient documentation

## 2023-11-13 NOTE — Assessment & Plan Note (Signed)
 Recommendation from years ago diagnostic mammogram and ultrasound, ordered.

## 2023-11-13 NOTE — Telephone Encounter (Signed)
 Spoke with patient. States she does not feel this needed any longer.

## 2023-11-15 NOTE — Telephone Encounter (Signed)
 Sent on 10/30/23 with refills

## 2024-03-04 ENCOUNTER — Other Ambulatory Visit: Payer: Self-pay | Admitting: Sports Medicine

## 2024-03-04 DIAGNOSIS — R002 Palpitations: Secondary | ICD-10-CM

## 2024-03-04 MED ORDER — METOPROLOL SUCCINATE ER 25 MG PO TB24: 25.0000 mg | ORAL_TABLET | Freq: Every day | ORAL | 3 refills | Status: DC

## 2024-03-04 NOTE — Telephone Encounter (Signed)
 Copied from CRM #[id number]. Topic: Clinical - Medication Refill >> Mar 04, 2024 11:41 AM Keoka S wrote: Medication: metoprolol  succinate (TOPROL -XL) 25 MG 24 hr tablet  Has the patient contacted their pharmacy? Yes (Agent: If no, request that the patient contact the pharmacy for the refill. If patient does not wish to contact the pharmacy document the reason why and proceed with request.) (Agent: If yes, when and what did the pharmacy advise?) Advised to contact provider's office   This is the patient's preferred pharmacy:  [id number] - [place], [place] - [address] AT [facility] OF [address] [place] [address] Phone: [phone] Fax: [phone]  Is this the correct pharmacy for this prescription? Yes If no, delete pharmacy and type the correct one.   Has the prescription been filled recently? No  Is the patient out of the medication? No, 5 remaining  Has the patient been seen for an appointment in the last year OR does the patient have an upcoming appointment? Yes  Can we respond through MyChart? Yes  Agent: Please be advised that Rx refills may take up to 3 business days. We ask that you follow-up with your pharmacy.

## 2024-03-17 ENCOUNTER — Encounter: Payer: Self-pay | Admitting: Sports Medicine

## 2024-05-22 ENCOUNTER — Telehealth: Payer: Self-pay

## 2024-05-22 NOTE — Telephone Encounter (Signed)
 Copied from CRM [id number]. Topic: Clinical - Request for Lab/Test Order >> May 22, 2024  4:02 PM Amy B wrote: Reason for CRM: [facility] called requesting an order for a right diagnostic mammogram and ultrasound if needed.  Patient has an appointment already scheduled on Monday 11/10.  Phone [phone] Fax [phone]

## 2024-05-22 NOTE — Telephone Encounter (Signed)
 Mammogram orders were placed 11/12/2023 by Dr. ONEIDA. Please advise.

## 2024-05-25 LAB — HM MAMMOGRAPHY

## 2024-05-25 NOTE — Telephone Encounter (Signed)
 I called the number again and no answer and no voicemail.

## 2024-05-26 ENCOUNTER — Encounter: Payer: Self-pay | Admitting: Sports Medicine

## 2024-06-26 ENCOUNTER — Other Ambulatory Visit: Payer: Self-pay | Admitting: *Deleted

## 2024-06-26 DIAGNOSIS — R002 Palpitations: Secondary | ICD-10-CM

## 2024-06-26 MED ORDER — METOPROLOL SUCCINATE ER 25 MG PO TB24: 25.0000 mg | ORAL_TABLET | Freq: Every day | ORAL | 3 refills | Status: AC

## 2024-07-27 ENCOUNTER — Other Ambulatory Visit: Payer: Self-pay | Admitting: Sports Medicine

## 2024-07-27 ENCOUNTER — Telehealth: Payer: Self-pay

## 2024-07-27 DIAGNOSIS — R002 Palpitations: Secondary | ICD-10-CM

## 2024-07-27 NOTE — Telephone Encounter (Signed)
 Patient is scheduled for TOC appt on 08/07/2024.

## 2024-07-27 NOTE — Telephone Encounter (Signed)
 Copied from CRM [id number]. Topic: Clinical - Medication Refill >> Jul 27, 2024 11:36 AM Rosaria A wrote: Medication: metoprolol  succinate (TOPROL -XL) 25 MG 24 hr tablet   Has the patient contacted their pharmacy? Yes Per Pharmacy was told to contact the office.   This is the patient's preferred pharmacy:  [id number] - [place], [place] - [address] AT [facility] OF [address] [place] [address] Phone: [phone] Fax: [phone]  Is this the correct pharmacy for this prescription? Yes  Has the prescription been filled recently? No  Is the patient out of the medication? Yes  Has the patient been seen for an appointment in the last year OR does the patient have an upcoming appointment? Yes  Can we respond through MyChart? No. Patient would like a phone call.   Agent: Please be advised that Rx refills may take up to 3 business days. We ask that you follow-up with your pharmacy.

## 2024-07-27 NOTE — Telephone Encounter (Signed)
 Copied from CRM [id number]. Topic: General - Other >> Jul 27, 2024 11:43 AM Rosaria A wrote: Reason for CRM: fyi- patient scheduled a TOC appointment with [facility]. I tried to enter patients new insurance but the system would not accept it. Patient will bring her new insurance card to her appointment. [employer] Id: [id number]

## 2024-07-30 NOTE — Telephone Encounter (Unsigned)
 Copied from CRM [id number]. Topic: Clinical - Prescription Issue >> Jul 30, 2024 10:55 AM Darshell M wrote: Reason for CRM: Pharmacy reporting they do not have the patient's metropolol prescription. Patient took last pill Monday. Patient requesting to speak with the nurse.  Patient CB# [phone]

## 2024-07-30 NOTE — Telephone Encounter (Signed)
 Spoke with patient states pharmacy is telling her that they do not have refills on Metoprolol  .  Called pharmacy and was told that they had placed this refill on hold but would start to get this ready for the patient for pick up.  Patient informed and will pick up the medication today .

## 2024-08-07 ENCOUNTER — Ambulatory Visit: Attending: Urgent Care

## 2024-08-07 ENCOUNTER — Ambulatory Visit: Admitting: Urgent Care

## 2024-08-07 ENCOUNTER — Encounter: Payer: Self-pay | Admitting: Urgent Care

## 2024-08-07 VITALS — BP 116/72 | HR 74 | Ht 65.0 in | Wt 223.0 lb

## 2024-08-07 DIAGNOSIS — R635 Abnormal weight gain: Secondary | ICD-10-CM | POA: Diagnosis not present

## 2024-08-07 DIAGNOSIS — Z1211 Encounter for screening for malignant neoplasm of colon: Secondary | ICD-10-CM

## 2024-08-07 DIAGNOSIS — D7589 Other specified diseases of blood and blood-forming organs: Secondary | ICD-10-CM | POA: Diagnosis not present

## 2024-08-07 DIAGNOSIS — Z3041 Encounter for surveillance of contraceptive pills: Secondary | ICD-10-CM

## 2024-08-07 DIAGNOSIS — Z6837 Body mass index (BMI) 37.0-37.9, adult: Secondary | ICD-10-CM

## 2024-08-07 DIAGNOSIS — R350 Frequency of micturition: Secondary | ICD-10-CM

## 2024-08-07 DIAGNOSIS — R002 Palpitations: Secondary | ICD-10-CM | POA: Diagnosis not present

## 2024-08-07 DIAGNOSIS — R7989 Other specified abnormal findings of blood chemistry: Secondary | ICD-10-CM | POA: Diagnosis not present

## 2024-08-07 MED ORDER — METFORMIN HCL ER 500 MG PO TB24: 500.0000 mg | ORAL_TABLET | Freq: Every day | ORAL | 0 refills | Status: AC

## 2024-08-07 NOTE — Progress Notes (Unsigned)
 EP to read.

## 2024-08-07 NOTE — Progress Notes (Signed)
 "  Established Patient Office Visit  Subjective:  Patient ID: Colleen Miller, female    DOB: 1975/11/18  Age: 49 y.o. MRN: [id number]  Chief Complaint  Patient presents with   Establish Care    HPI  Discussed the use of AI scribe software for clinical note transcription with the patient, who gave verbal consent to proceed.  History of Present Illness   Colleen Miller is a 49 year old female who presents with palpitations and urinary frequency.  She experiences palpitations primarily at rest, such as when sitting in a car or lying down at night. The sensation is described as her heart beating 'really hard' and sometimes 'really fast,' with occasional skipped beats. She has been taking metoprolol  once daily. No palpitations are felt during the day when she is active. In 2018, an EKG showed no abnormalities, but a repeat EKG last year revealed a couple of premature ventricular contractions (PVCs). She does not recall having an echocardiogram, although it was ordered, and she has not worn a heart monitor previously. She mentions a family history of heart problems, which contributes to her concern about her palpitations.  She experiences urinary frequency, particularly at night, waking up three times to urinate. There is an urgent need to urinate upon waking. During the day, she limits her fluid intake to avoid frequent urination at work. She drinks a bottle of water before bed and consumes a cup of coffee in the morning and a soda at lunch or dinner. She has previously tried Mirabegron  for this issue without success.  She takes a daily multivitamin and has recently started a magnesium complex supplement, taking two pills daily. She also continues to use birth control, specifically levonorgestrel and estradiol .  She has a history of migraines, which have improved significantly since she had surgery to remove her fourth disc. She has not experienced a migraine since last year, although she still has  slight headaches occasionally.       Patient Active Problem List   Diagnosis Date Noted   Abnormality of breast on screening mammography 11/13/2023   Palpitations 07/19/2023   Cervical myelopathy (HCC) 04/25/2023   Overactive bladder 02/06/2023   Hyperlipidemia 07/21/2021   Leukocytosis 07/21/2021   Impingement syndrome, shoulder, left 07/19/2021   DDD (degenerative disc disease), cervical 10/16/2018   DDD (degenerative disc disease), lumbar 10/16/2018   Menometrorrhagia 10/16/2018   Migraine headache without aura 11/15/2015   Annual physical exam 09/27/2014   Past Medical History:  Diagnosis Date   Obesity (BMI 30-39.9)    Past Surgical History:  Procedure Laterality Date   ANKLE FRACTURE SURGERY     Social History[1]    ROS: as noted in HPI  Objective:     BP 116/72   Pulse 74   Ht 5' 5 (1.651 m)   Wt 223 lb (101.2 kg)   SpO2 98%   BMI 37.11 kg/m  BP Readings from Last 3 Encounters:  08/07/24 116/72  07/19/23 119/85  06/25/23 130/84   Wt Readings from Last 3 Encounters:  08/07/24 223 lb (101.2 kg)  07/19/23 189 lb (85.7 kg)  06/25/23 191 lb (86.6 kg)      Physical Exam Vitals and nursing note reviewed. Exam conducted with a chaperone present.  Constitutional:      General: She is not in acute distress.    Appearance: Normal appearance. She is not ill-appearing, toxic-appearing or diaphoretic.  HENT:     Head: Normocephalic and atraumatic.     Nose: Nose normal.  No congestion or rhinorrhea.     Mouth/Throat:     Mouth: Mucous membranes are moist.     Pharynx: Oropharynx is clear. No oropharyngeal exudate or posterior oropharyngeal erythema.  Eyes:     General: No scleral icterus.       Right eye: No discharge.        Left eye: No discharge.     Extraocular Movements: Extraocular movements intact.     Pupils: Pupils are equal, round, and reactive to light.  Cardiovascular:     Rate and Rhythm: Normal rate and regular rhythm.     Comments: A  few PVCs ausculatated on exam Pulmonary:     Effort: Pulmonary effort is normal. No respiratory distress.     Breath sounds: Normal breath sounds. No stridor. No wheezing or rhonchi.  Musculoskeletal:     Cervical back: Normal range of motion and neck supple. No rigidity or tenderness.  Lymphadenopathy:     Cervical: No cervical adenopathy.  Skin:    General: Skin is warm and dry.     Coloration: Skin is not jaundiced.     Findings: No bruising, erythema or rash.  Neurological:     General: No focal deficit present.     Mental Status: She is alert and oriented to person, place, and time.     Gait: Gait normal.  Psychiatric:        Mood and Affect: Mood normal.        Behavior: Behavior normal.      No results found for any visits on 08/07/24.  Last CBC Lab Results  Component Value Date   WBC 7.1 06/25/2023   HGB 15.6 06/25/2023   HCT 46.6 06/25/2023   MCV 100 (H) 06/25/2023   MCH 33.5 (H) 06/25/2023   RDW 11.6 (L) 06/25/2023   PLT 345 06/25/2023   Last metabolic panel Lab Results  Component Value Date   GLUCOSE 111 (H) 06/25/2023   NA 142 06/25/2023   K 5.0 06/25/2023   CL 103 06/25/2023   CO2 23 06/25/2023   BUN 15 06/25/2023   CREATININE 1.06 (H) 06/25/2023   EGFR 65 06/25/2023   CALCIUM  10.4 (H) 06/25/2023   PROT 7.5 06/25/2023   ALBUMIN 4.7 06/25/2023   LABGLOB 2.8 06/25/2023   BILITOT 0.6 06/25/2023   ALKPHOS 60 06/25/2023   AST 21 06/25/2023   ALT 20 06/25/2023   Last lipids Lab Results  Component Value Date   CHOL 218 (H) 06/25/2023   HDL 48 06/25/2023   LDLCALC 151 (H) 06/25/2023   TRIG 105 06/25/2023   CHOLHDL 4.5 (H) 06/25/2023   Last hemoglobin A1c Lab Results  Component Value Date   HGBA1C 5.3 02/08/2023   Last thyroid  functions Lab Results  Component Value Date   TSH 1.770 06/25/2023   Last vitamin D  Lab Results  Component Value Date   VD25OH 24 (L) 10/20/2018   Last vitamin B12 and Folate Lab Results  Component Value Date    VITAMINB12 485 06/25/2023      The 10-year ASCVD risk score (Arnett DK, et al., 2019) is: 1.2%  Assessment & Plan:  Macrocytosis without anemia -     B12 and Folate Panel  Weight gain -     TSH + free T4 -     Lipid panel -     metFORMIN HCl ER; Take 1 tablet (500 mg total) by mouth daily with breakfast.  Dispense: 30 tablet; Refill: 0  Iron excess -  Iron, TIBC and Ferritin Panel  Hypercalcemia -     Parathyroid hormone, intact (no Ca) -     VITAMIN D  25 Hydroxy (Vit-D Deficiency, Fractures)  Elevated serum creatinine -     CMP14+EGFR  Encounter for surveillance of contraceptive pills -     Ambulatory referral to Obstetrics / Gynecology  Palpitations -     CMP14+EGFR -     CBC with Differential/Platelet -     TSH + free T4 -     Iron, TIBC and Ferritin Panel -     Magnesium -     B12 and Folate Panel -     LONG TERM MONITOR (3-14 DAYS); Future  Urinary frequency -     Hemoglobin A1c  Class 2 obesity without serious comorbidity with body mass index (BMI) of 37.0 to 37.9 in adult, unspecified obesity type -     metFORMIN HCl ER; Take 1 tablet (500 mg total) by mouth daily with breakfast.  Dispense: 30 tablet; Refill: 0  Colon cancer screening -     Cologuard  Assessment and Plan    Palpitations Intermittent palpitations with previous EKG showing PVCs. Metoprolol  ineffective. Differential includes PVCs or other arrhythmias. - Ordered 14-day heart monitor to assess for arrhythmias. - Repeated labs to check for electrolyte imbalances. - Checked magnesium and vitamin D  levels.  Urinary frequency Increased frequency and urgency, particularly at night. Mirabegron  ineffective. Possible caffeine and fluid intake factors. - Advised reducing caffeine intake, especially in the evening. - Recommended adjusting fluid intake to earlier in the day. - Discussed potential use of Intone device with gynecology. - Rechecked A1c to rule out diabetes.  Class 2  obesity Interest in weight loss options. Insurance coverage for medications uncertain. Previous medications ineffective. Discussed metformin for insurance requirements. - Prescribed metformin for weight loss. - Discussed alternative weight loss options including Wegovy pill and Zepbound pending response to metformin  Iron excess Previous labs showed high iron levels. Taking multivitamin with unknown iron content. - Repeated iron panel to assess current levels. - Advised checking multivitamin label for iron content.  Hypercalcemia Previous labs indicated elevated calcium  levels. Possible vitamin D  supplementation contributing. - Repeated calcium  level to assess current status. - Checked vitamin D  level.  General Health Maintenance Due for routine screenings and preventive health measures. - Placed referral to gynecology for Pap smear and routine care. - Ordered Cologuard test for colorectal cancer screening. - Ordered lipid panel and B12 level.       I spent 45 minutes of total time managing this patient today, this includes chart review, face to face, and non-face to face time, reviewing outside records and labs and providing personal interpretation.   No follow-ups on file.   Benton LITTIE Gave, PA     [1]  Social History Tobacco Use   Smoking status: Former    Current packs/day: 0.50    Average packs/day: 0.5 packs/day for 26.0 years (13.0 ttl pk-yrs)    Types: Cigarettes   Smokeless tobacco: Never  Vaping Use   Vaping status: Every Day  Substance Use Topics   Alcohol use: No    Alcohol/week: 0.0 standard drinks of alcohol   Drug use: No   "

## 2024-08-07 NOTE — Patient Instructions (Addendum)
 Please complete the zio patch to assess your heart. Please continue metoprolol  for now.  We drew your labs. Let me know about contents of your multivitamin.  Please follow up with gynecology; discuss urinary frequency with them. Consider Intone? Cut out caffeine after noon. Try to drink more water during the day and less prior to bed  Complete cologuard.   Follow up with me in 6 months, sooner pending lab results

## 2024-08-09 ENCOUNTER — Ambulatory Visit: Payer: Self-pay | Admitting: Urgent Care

## 2024-08-09 DIAGNOSIS — R946 Abnormal results of thyroid function studies: Secondary | ICD-10-CM

## 2024-08-09 LAB — CMP14+EGFR
ALT: 24 [IU]/L (ref 0–32)
AST: 22 [IU]/L (ref 0–40)
Albumin: 4.7 g/dL (ref 3.9–4.9)
Alkaline Phosphatase: 80 [IU]/L (ref 41–116)
BUN/Creatinine Ratio: 14 (ref 9–23)
BUN: 15 mg/dL (ref 6–24)
Bilirubin Total: 0.2 mg/dL (ref 0.0–1.2)
CO2: 17 mmol/L — ABNORMAL LOW (ref 20–29)
Calcium: 10.1 mg/dL (ref 8.7–10.2)
Chloride: 102 mmol/L (ref 96–106)
Creatinine, Ser: 1.11 mg/dL — ABNORMAL HIGH (ref 0.57–1.00)
Globulin, Total: 3.3 g/dL (ref 1.5–4.5)
Glucose: 95 mg/dL (ref 70–99)
Potassium: 4.3 mmol/L (ref 3.5–5.2)
Sodium: 140 mmol/L (ref 134–144)
Total Protein: 8 g/dL (ref 6.0–8.5)
eGFR: 61 mL/min/{1.73_m2}

## 2024-08-09 LAB — LIPID PANEL
Chol/HDL Ratio: 4.8 ratio — ABNORMAL HIGH (ref 0.0–4.4)
Cholesterol, Total: 258 mg/dL — ABNORMAL HIGH (ref 100–199)
HDL: 54 mg/dL
LDL Chol Calc (NIH): 165 mg/dL — ABNORMAL HIGH (ref 0–99)
Triglycerides: 209 mg/dL — ABNORMAL HIGH (ref 0–149)
VLDL Cholesterol Cal: 39 mg/dL (ref 5–40)

## 2024-08-09 LAB — CBC WITH DIFFERENTIAL/PLATELET
Basophils Absolute: 0.1 10*3/uL (ref 0.0–0.2)
Basos: 1 %
EOS (ABSOLUTE): 0.3 10*3/uL (ref 0.0–0.4)
Eos: 4 %
Hematocrit: 45.1 % (ref 34.0–46.6)
Hemoglobin: 14.9 g/dL (ref 11.1–15.9)
Immature Grans (Abs): 0 10*3/uL (ref 0.0–0.1)
Immature Granulocytes: 0 %
Lymphocytes Absolute: 1.8 10*3/uL (ref 0.7–3.1)
Lymphs: 26 %
MCH: 32.7 pg (ref 26.6–33.0)
MCHC: 33 g/dL (ref 31.5–35.7)
MCV: 99 fL — ABNORMAL HIGH (ref 79–97)
Monocytes Absolute: 0.7 10*3/uL (ref 0.1–0.9)
Monocytes: 10 %
Neutrophils Absolute: 4.2 10*3/uL (ref 1.4–7.0)
Neutrophils: 59 %
Platelets: 309 10*3/uL (ref 150–450)
RBC: 4.56 x10E6/uL (ref 3.77–5.28)
RDW: 11.8 % (ref 11.7–15.4)
WBC: 7 10*3/uL (ref 3.4–10.8)

## 2024-08-09 LAB — B12 AND FOLATE PANEL: Vitamin B-12: 423 pg/mL (ref 232–1245)

## 2024-08-09 LAB — IRON,TIBC AND FERRITIN PANEL
Ferritin: 123 ng/mL (ref 15–150)
Iron Saturation: 41 % (ref 15–55)
Iron: 152 ug/dL (ref 27–159)
Total Iron Binding Capacity: 374 ug/dL (ref 250–450)
UIBC: 222 ug/dL (ref 131–425)

## 2024-08-09 LAB — VITAMIN D 25 HYDROXY (VIT D DEFICIENCY, FRACTURES): Vit D, 25-Hydroxy: 37 ng/mL (ref 30.0–100.0)

## 2024-08-09 LAB — MAGNESIUM: Magnesium: 2.2 mg/dL (ref 1.6–2.3)

## 2024-08-09 LAB — TSH+FREE T4
Free T4: 0.76 ng/dL — ABNORMAL LOW (ref 0.82–1.77)
TSH: 3.53 u[IU]/mL (ref 0.450–4.500)

## 2024-08-09 LAB — PARATHYROID HORMONE, INTACT (NO CA): PTH: 17 pg/mL (ref 15–65)

## 2024-08-09 LAB — HEMOGLOBIN A1C
Est. average glucose Bld gHb Est-mCnc: 105 mg/dL
Hgb A1c MFr Bld: 5.3 (ref 4.8–5.6)

## 2024-08-19 ENCOUNTER — Telehealth: Payer: Self-pay

## 2024-08-19 NOTE — Telephone Encounter (Unsigned)
 Copied from CRM [id number]. Topic: Clinical - Request for Lab/Test Order >> Aug 19, 2024 10:46 AM Mia F wrote: Reason for CRM: Patient is calling to check the status f the cologaurd she requested. She says that she was last told back in January that the office was checking with the insurance company to see if it would be approved

## 2024-09-02 ENCOUNTER — Encounter: Admitting: Obstetrics & Gynecology

## 2025-02-04 ENCOUNTER — Ambulatory Visit: Admitting: Urgent Care
